# Patient Record
Sex: Female | Born: 1965 | Race: White | Hispanic: No | Marital: Single | Smoking: Never smoker
Health system: Southern US, Community
[De-identification: ages and names within clinical notes are randomized; demographics above are authoritative.]

## PROBLEM LIST (undated history)

## (undated) DIAGNOSIS — E559 Vitamin D deficiency, unspecified: Secondary | ICD-10-CM

## (undated) DIAGNOSIS — F419 Anxiety disorder, unspecified: Secondary | ICD-10-CM

## (undated) DIAGNOSIS — E039 Hypothyroidism, unspecified: Secondary | ICD-10-CM

## (undated) DIAGNOSIS — J309 Allergic rhinitis, unspecified: Secondary | ICD-10-CM

## (undated) DIAGNOSIS — K2970 Gastritis, unspecified, without bleeding: Secondary | ICD-10-CM

## (undated) DIAGNOSIS — K219 Gastro-esophageal reflux disease without esophagitis: Secondary | ICD-10-CM

## (undated) DIAGNOSIS — F341 Dysthymic disorder: Secondary | ICD-10-CM

## (undated) HISTORY — DX: Allergic rhinitis, unspecified: J30.9

## (undated) HISTORY — DX: Gastritis, unspecified, without bleeding: K29.70

## (undated) HISTORY — PX: KNEE SURGERY: SHX244

## (undated) HISTORY — DX: Hypothyroidism, unspecified: E03.9

## (undated) HISTORY — DX: Vitamin D deficiency, unspecified: E55.9

## (undated) HISTORY — DX: Gastro-esophageal reflux disease without esophagitis: K21.9

## (undated) HISTORY — DX: Anxiety disorder, unspecified: F41.9

## (undated) HISTORY — DX: Dysthymic disorder: F34.1

---

## 2012-03-26 ENCOUNTER — Other Ambulatory Visit: Payer: Self-pay | Admitting: Obstetrics & Gynecology

## 2012-03-26 DIAGNOSIS — Z1231 Encounter for screening mammogram for malignant neoplasm of breast: Secondary | ICD-10-CM

## 2012-04-13 ENCOUNTER — Ambulatory Visit: Payer: Self-pay

## 2012-04-29 ENCOUNTER — Ambulatory Visit: Payer: Self-pay

## 2012-08-04 ENCOUNTER — Ambulatory Visit
Admission: RE | Admit: 2012-08-04 | Discharge: 2012-08-04 | Disposition: A | Discharge status: Home / Self Care | Payer: Commercial Managed Care - PPO | Source: Ambulatory Visit | Attending: Obstetrics & Gynecology | Admitting: Obstetrics & Gynecology

## 2012-08-04 DIAGNOSIS — Z1231 Encounter for screening mammogram for malignant neoplasm of breast: Secondary | ICD-10-CM

## 2013-03-24 ENCOUNTER — Ambulatory Visit (INDEPENDENT_AMBULATORY_CARE_PROVIDER_SITE_OTHER): Payer: Commercial Managed Care - PPO | Admitting: Medical

## 2013-03-24 ENCOUNTER — Encounter: Payer: Self-pay | Admitting: Medical

## 2013-03-24 VITALS — BP 120/70 | HR 60 | Temp 98.1°F | Resp 16 | Wt 141.0 lb

## 2013-03-24 DIAGNOSIS — K2970 Gastritis, unspecified, without bleeding: Secondary | ICD-10-CM

## 2013-03-24 DIAGNOSIS — F4010 Social phobia, unspecified: Secondary | ICD-10-CM

## 2013-03-24 DIAGNOSIS — E039 Hypothyroidism, unspecified: Secondary | ICD-10-CM

## 2013-03-24 DIAGNOSIS — F40298 Other specified phobia: Secondary | ICD-10-CM

## 2013-03-24 DIAGNOSIS — F411 Generalized anxiety disorder: Secondary | ICD-10-CM

## 2013-03-24 DIAGNOSIS — F40248 Other situational type phobia: Secondary | ICD-10-CM

## 2013-03-24 DIAGNOSIS — F40243 Fear of flying: Secondary | ICD-10-CM

## 2013-03-24 NOTE — Progress Notes (Signed)
Subjective:  Colleen Berry is a 47 y.o. female who presents as a new patient today.  Moved from [place] to here 2011 for work.     Here to establish care.     She reports hx/o stomach issues.  2003 was treated with Aciphex for 37mo, did fine on this.  Has had intermittent issues with bloating, belching, excessive saliva in the throat since then.   Recently has had flare up that she has attributed to drinking wine on empty stomach or drinking more than 1 glass of wine at a time.  Things have improved since she cut back on wine intake.  Doesn't want to take any medication for this currently.    Has hx/o hypothyroidism.  Has ben on synthroid in the past, but for years now has been on Armour Thyroid.    In the past has used Valium or Clonazepam short term for flight trips or public speaking.  Flies to [place] about once yearly.  Would like to be able to get short term script for Valium or Clonazepam in the future.  No other c/o.  The following portions of the patient's history were reviewed and updated as appropriate: allergies, current medications, past family history, past medical history, past social history, past surgical history and problem list.  ROS Otherwise as in subjective above  Objective: Physical Exam  Vital signs reviewed  General appearance: alert, no distress, WD/WN HEENT: normocephalic, sclerae anicteric, conjunctiva pink and moist, TMs pearly, nares patent, no discharge or erythema, pharynx normal Oral cavity: MMM, no lesions Neck: supple, no lymphadenopathy, no thyromegaly, no masses Heart: RRR, normal S1, S2, no murmurs Lungs: CTA bilaterally, no wheezes, rhonchi, or rales Abdomen: +bs, soft, non tender, non distended, no masses, no hepatomegaly, no splenomegaly Pulses: 2+ radial pulses, 2+ pedal pulses, normal cap refill Ext: no edema   Assessment: Encounter Diagnoses  Name Primary?  . Gastritis Yes  . Hypothyroidism   . Anxiety state, unspecified   . Fear of  public speaking   . Fear of flying      Plan: Gastritis - avoid trigger, limit alcohol, and recheck if still causing problems Hypothyroidism - reviewed recent thyroid labs and CBC from gynecology which were normal Anxiety, fear of public speaking and flying - can use short term benzodiazepines prn for specific use. No prescription today.  Follow up: prn

## 2013-03-29 ENCOUNTER — Encounter: Payer: Self-pay | Admitting: Medical

## 2013-04-23 ENCOUNTER — Ambulatory Visit (INDEPENDENT_AMBULATORY_CARE_PROVIDER_SITE_OTHER): Payer: Commercial Managed Care - PPO | Admitting: Medical

## 2013-04-23 ENCOUNTER — Encounter: Payer: Self-pay | Admitting: Medical

## 2013-04-23 VITALS — BP 90/60 | HR 68 | Temp 97.9°F | Resp 16 | Wt 140.0 lb

## 2013-04-23 DIAGNOSIS — R110 Nausea: Secondary | ICD-10-CM

## 2013-04-23 DIAGNOSIS — K2970 Gastritis, unspecified, without bleeding: Secondary | ICD-10-CM

## 2013-04-23 MED ORDER — RABEPRAZOLE SODIUM 20 MG PO TBEC: DELAYED_RELEASE_TABLET | ORAL | Status: DC

## 2013-04-23 NOTE — Progress Notes (Signed)
Subjective: Here for "flare up" of stomach.   She has an underlying hx/o intermittent gastritis in the past.   Back in her '30s, had been seen by [facility] for similar, had thorough evaluation, endoscopy, other tests, and reportedly all were normal, not even showing GERD.  She ended up seeing a primary care provider who put her on Aciphex for a period of time and this resolved her symptoms.  She has gotten flare ups periodically over time, and she usually brings this on herself with dietary changes.   This past week she has had a big flare up of nausea and stomach issues that she attributes to drinking wine on an empty stomach.  Lately in the evenings she drinks about two 3 oz glasses of wine in the evening right when she gets home, then she eats dinner a little later.   Sometimes this doesn't cause problems but this past few days it really aggravated her stomach.   She has used some OTC Prilosec, tums and Pepto Bismol.  She is a little better today, but she wants to try back on Aciphex.  She is a nonsomker.  No family hx/o GI cancer.  No other aggravating or relieving factors.  No other c/o.  ROS as in subjective  Objective: Filed Vitals:   04/23/13 1610  BP: 90/60  Pulse: 68  Temp: 97.9 F (36.6 C)  Resp: 16    General appearance: alert, no distress, WD/WN Abdomen: +bs, soft, non tender, non distended, no masses, no hepatomegaly, no splenomegaly Oral: no lesions, pharynx normal   Assessment: Encounter Diagnoses  Name Primary?  . Gastritis Yes  . Nausea alone      Plan: Discussed symptoms, likely gastritis.  Recommendations were to limit alcohol intake in general, avoid alcohol on empty stomach,avoid acid foods and GERD triggers for now.   Begin short term Aciphex x 1-2 wk.  Can use additional tums and Zantac short term as well.  If not improving in 2wk, then call or return.  If having frequent flares ups in the next 1-2 mo, then recheck.  Plan to have endoscopy and colonoscopy age  47yo unless these symptoms worsen.

## 2013-04-23 NOTE — Patient Instructions (Addendum)
For flare ups, back off of spicy or acidic foods, caffeine, alcohol.   Begin Aciphex daily for 1-2 weeks.  You can also use tums, or antacids, or Zantac or Pepto as other remedies to help.  Gastritis, Adult Gastritis is soreness and swelling (inflammation) of the lining of the stomach. Gastritis can develop as a sudden onset (acute) or long-term (chronic) condition. If gastritis is not treated, it can lead to stomach bleeding and ulcers. CAUSES  Gastritis occurs when the stomach lining is weak or damaged. Digestive juices from the stomach then inflame the weakened stomach lining. The stomach lining may be weak or damaged due to viral or bacterial infections. One common bacterial infection is the Helicobacter pylori infection. Gastritis can also result from excessive alcohol consumption, taking certain medicines, or having too much acid in the stomach.  SYMPTOMS  In some cases, there are no symptoms. When symptoms are present, they may include:  Pain or a burning sensation in the upper abdomen.  Nausea.  Vomiting.  An uncomfortable feeling of fullness after eating. DIAGNOSIS  Your caregiver may suspect you have gastritis based on your symptoms and a physical exam. To determine the cause of your gastritis, your caregiver may perform the following:  Blood or stool tests to check for the H pylori bacterium.  Gastroscopy. A thin, flexible tube (endoscope) is passed down the esophagus and into the stomach. The endoscope has a light and camera on the end. Your caregiver uses the endoscope to view the inside of the stomach.  Taking a tissue sample (biopsy) from the stomach to examine under a microscope. TREATMENT  Depending on the cause of your gastritis, medicines may be prescribed. If you have a bacterial infection, such as an H pylori infection, antibiotics may be given. If your gastritis is caused by too much acid in the stomach, H2 blockers or antacids may be given. Your caregiver may recommend  that you stop taking aspirin, ibuprofen, or other nonsteroidal anti-inflammatory drugs (NSAIDs). HOME CARE INSTRUCTIONS  Only take over-the-counter or prescription medicines as directed by your caregiver.  If you were given antibiotic medicines, take them as directed. Finish them even if you start to feel better.  Drink enough fluids to keep your urine clear or pale yellow.  Avoid foods and drinks that make your symptoms worse, such as:  Caffeine or alcoholic drinks.  Chocolate.  Peppermint or mint flavorings.  Garlic and onions.  Spicy foods.  Citrus fruits, such as oranges, lemons, or limes.  Tomato-based foods such as sauce, chili, salsa, and pizza.  Fried and fatty foods.  Eat small, frequent meals instead of large meals. SEEK IMMEDIATE MEDICAL CARE IF:   You have black or dark red stools.  You vomit blood or material that looks like coffee grounds.  You are unable to keep fluids down.  Your abdominal pain gets worse.  You have a fever.  You do not feel better after 1 week.  You have any other questions or concerns. MAKE SURE YOU:  Understand these instructions.  Will watch your condition.  Will get help right away if you are not doing well or get worse. Document Released: 07/30/2001 Document Revised: 02/04/2012 Document Reviewed: 09/18/2011 [facility] Patient Information 2014 [place], [place].

## 2013-07-27 ENCOUNTER — Encounter: Payer: Self-pay | Admitting: Medical

## 2013-07-27 ENCOUNTER — Ambulatory Visit (INDEPENDENT_AMBULATORY_CARE_PROVIDER_SITE_OTHER): Payer: Commercial Managed Care - PPO | Admitting: Medical

## 2013-07-27 VITALS — BP 92/60 | HR 78 | Temp 98.2°F | Resp 14 | Wt 140.0 lb

## 2013-07-27 DIAGNOSIS — J329 Chronic sinusitis, unspecified: Secondary | ICD-10-CM

## 2013-07-27 DIAGNOSIS — R5381 Other malaise: Secondary | ICD-10-CM

## 2013-07-27 DIAGNOSIS — M791 Myalgia, unspecified site: Secondary | ICD-10-CM

## 2013-07-27 DIAGNOSIS — IMO0001 Reserved for inherently not codable concepts without codable children: Secondary | ICD-10-CM

## 2013-07-27 DIAGNOSIS — E559 Vitamin D deficiency, unspecified: Secondary | ICD-10-CM

## 2013-07-27 LAB — CBC WITH DIFFERENTIAL/PLATELET
Basophils Absolute: 0 10*3/uL (ref 0.0–0.1)
Eosinophils Absolute: 0.1 10*3/uL (ref 0.0–0.7)
Eosinophils Relative: 1 % (ref 0–5)
HCT: 38.4 % (ref 36.0–46.0)
Lymphocytes Relative: 28 % (ref 12–46)
Lymphs Abs: 2.3 10*3/uL (ref 0.7–4.0)
MCH: 31.8 pg (ref 26.0–34.0)
MCV: 93.2 fL (ref 78.0–100.0)
Monocytes Absolute: 1.1 10*3/uL — ABNORMAL HIGH (ref 0.1–1.0)
Platelets: 228 10*3/uL (ref 150–400)
RDW: 13.4 % (ref 11.5–15.5)
WBC: 8.4 10*3/uL (ref 4.0–10.5)

## 2013-07-27 LAB — CK: Total CK: 51 U/L (ref 7–177)

## 2013-07-27 LAB — POCT URINALYSIS DIPSTICK
Blood, UA: NEGATIVE
Ketones, UA: NEGATIVE
Spec Grav, UA: 1.02

## 2013-07-27 LAB — IRON AND TIBC
Iron: 115 ug/dL (ref 42–145)
TIBC: 312 ug/dL (ref 250–470)
UIBC: 197 ug/dL (ref 125–400)

## 2013-07-27 LAB — COMPREHENSIVE METABOLIC PANEL
BUN: 20 mg/dL (ref 6–23)
CO2: 25 mEq/L (ref 19–32)
Calcium: 9.5 mg/dL (ref 8.4–10.5)
Chloride: 108 mEq/L (ref 96–112)
Creat: 0.67 mg/dL (ref 0.50–1.10)
Total Bilirubin: 0.2 mg/dL — ABNORMAL LOW (ref 0.3–1.2)

## 2013-07-27 NOTE — Progress Notes (Signed)
Subjective Here for not feeling well.  She notes having similar symptoms 6wk that ultimately resolved after about a week, and now has new episode of symptoms of fatigue and aches.  Over the weekend she started feeling ill.   She notes 3 days ago started feeling extreme fatigue, muscle aches, muscular fatigue, sleepy, stayed in bed all day.  Has felt hot and flushed.  Has had sore irritated throat, pressure in sinuses.  Denies sweats, no chills, no sneezing, no coughing, was unable to go to work Monday.   Today she only notes 10% of symptoms, some improvement.    Review of Systems Constitutional: +subjectively fever/hot/flushed, -chills, -sweats, -unexpected weight change, +fatigue ENT: -runny nose, -ear pain, -sore throat, +mild sinus irritation/itching, no purulent nasal drainage, -sneezing, no teeth pain Cardiology:  -chest pain, -palpitations, -edema Respiratory: -cough, -shortness of breath, -wheezing Gastroenterology: -abdominal pain, -nausea, -vomiting, -diarrhea, -constipation Hematology: -bleeding or bruising problems Musculoskeletal: -arthralgias, +myalgias, -joint swelling, +back soreness Ophthalmology: -vision changes Urology: -dysuria, -difficulty urinating, -hematuria, -urinary frequency, -urgency Neurology: -headache, -weakness, -tingling, -numbness   Past Medical History  Diagnosis Date  . Hypothyroidism   . Allergic rhinitis     dust  . Dysthymia   . Anxiety     fear of flight, public speaking  . Gastritis   . GERD (gastroesophageal reflux disease)   . Vitamin D deficiency   . Routine gynecological examination     Dr. Juliene Pina, pap 1/14  . H/O mammogram 2/14   Objective: Filed Vitals:   07/27/13 1612  BP: 92/60  Pulse: 78  Temp: 98.2 F (36.8 C)  Resp: 14    General appearance: alert, no distress, WD/WN HEENT: normocephalic, sclerae anicteric, TMs flat, left TM with serous effusion, nares patent, but some turbinates edema, no discharge, + erythema, pharynx with  mild erythema Oral cavity: MMM, no lesions Neck: supple, no lymphadenopathy, no thyromegaly, no masses Heart: RRR, normal S1, S2, no murmurs Lungs: CTA bilaterally, no wheezes, rhonchi, or rales Abdomen: +bs, soft, non tender, non distended, no masses, no hepatomegaly, no splenomegaly Pulses: 2+ symmetric, upper and lower extremities, normal cap refill Neuro: CN2-12 intact, nonfocal exam Ext: no edema   Assessment: Encounter Diagnoses  Name Primary?  . Other malaise and fatigue Yes  . Myalgia   . Unspecified vitamin D deficiency   . Sinusitis      Plan: After reviewing symptoms, her 2 episodes, 6wk ago and current episodes could be associated with either sinusitis or individual URIs, likely contracted through exposure to sick contacts.  However ,she request labs to help rule out other causes of fatigue.  Thus, lab panel today, rest ,hydrate well, consider OTC mucinex for possible sinus symptoms, salt water gargles.  Follow-up pending labs.

## 2013-07-28 LAB — VITAMIN D 25 HYDROXY (VIT D DEFICIENCY, FRACTURES): Vit D, 25-Hydroxy: 95 ng/mL — ABNORMAL HIGH (ref 30–89)

## 2013-07-28 LAB — TSH: TSH: 0.34 u[IU]/mL — ABNORMAL LOW (ref 0.350–4.500)

## 2013-07-28 LAB — T3: T3, Total: 55 ng/dL — ABNORMAL LOW (ref 80.0–204.0)

## 2014-01-24 ENCOUNTER — Ambulatory Visit (INDEPENDENT_AMBULATORY_CARE_PROVIDER_SITE_OTHER): Payer: Commercial Managed Care - PPO | Admitting: Medical

## 2014-01-24 ENCOUNTER — Encounter: Payer: Self-pay | Admitting: Medical

## 2014-01-24 VITALS — BP 102/70 | HR 62 | Temp 97.6°F | Resp 16 | Wt 148.0 lb

## 2014-01-24 DIAGNOSIS — F418 Other specified anxiety disorders: Secondary | ICD-10-CM

## 2014-01-24 DIAGNOSIS — F411 Generalized anxiety disorder: Secondary | ICD-10-CM

## 2014-01-24 MED ORDER — ALPRAZOLAM 0.5 MG PO TABS: 0.5000 mg | ORAL_TABLET | Freq: Every evening | ORAL | Status: DC | PRN

## 2014-01-24 MED ORDER — DIAZEPAM 10 MG PO TABS: ORAL_TABLET | ORAL | Status: DC

## 2014-01-24 NOTE — Progress Notes (Signed)
   Subjective:   Colleen Berry is a 48 y.o. female presenting on 01/24/2014 with ANXIETY MEDICATION FOR FLYING  Here to request Valium.  In the past has used this when flying given anxiety with flying.  generally gets palpitations, feeling on edge, elevated heart rate with turbulence.  No SOB, numbness, tingling.  Otherwise doing ok.  Is a Pensions consultant.  Is going to [place] and [place]. [place] and doing other flying tours.  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  Still taking 3 Armour Thyroid tablets daily.  Sees Alternative Medicine specialist for thyroid.  She is a nonsmoker.  Drinks wine occasionally.  No other aggravating or relieving factors.  No other complaint.  Review of Systems ROS as in subjective      Objective:     Filed Vitals:   01/24/14 0901  BP: 102/70  Pulse: 62  Temp: 97.6 F (36.4 C)  Resp: 16    General appearance: alert, no distress, WD/WN      Assessment: Encounter Diagnosis  Name Primary?  . Situational anxiety Yes     Plan: Scripts for both Valium for long flights, and Xanax for sleep and small planes.  don't use both same day, discussed proper use, risks/benefits of medication    Colleen Berry was seen today for anxiety medication for flying.  Diagnoses and associated orders for this visit:  Situational anxiety  Other Orders - diazepam (VALIUM) 10 MG tablet; 1/2-1 tablet po prn for flight anxiety - ALPRAZolam (XANAX) 0.5 MG tablet; Take 1 tablet (0.5 mg total) by mouth at bedtime as needed for anxiety.    Return if symptoms worsen or fail to improve.

## 2014-04-06 ENCOUNTER — Telehealth: Payer: Self-pay | Admitting: Medical

## 2014-04-06 ENCOUNTER — Other Ambulatory Visit: Payer: Self-pay | Admitting: Medical

## 2014-04-06 MED ORDER — DIAZEPAM 10 MG PO TABS: ORAL_TABLET | ORAL | Status: DC

## 2014-04-06 NOTE — Telephone Encounter (Signed)
Please  Call and advise pt when/if Rx called in  Patient states you gave rx previously for a trip to [place] and now she is going to [place] and wants to know if you can give Rx for that trip    [employer]

## 2014-04-06 NOTE — Telephone Encounter (Signed)
Pt notified of results

## 2014-04-06 NOTE — Telephone Encounter (Signed)
Error, I meant to typee med was called into pharmacy

## 2014-04-06 NOTE — Telephone Encounter (Signed)
Call out Valium

## 2014-06-16 ENCOUNTER — Telehealth: Payer: Self-pay | Admitting: Medical

## 2014-06-16 ENCOUNTER — Ambulatory Visit (INDEPENDENT_AMBULATORY_CARE_PROVIDER_SITE_OTHER): Payer: Commercial Managed Care - PPO | Admitting: Medical

## 2014-06-16 ENCOUNTER — Encounter: Payer: Self-pay | Admitting: Medical

## 2014-06-16 VITALS — BP 108/68 | HR 60 | Temp 97.4°F | Resp 15

## 2014-06-16 DIAGNOSIS — R140 Abdominal distension (gaseous): Secondary | ICD-10-CM

## 2014-06-16 DIAGNOSIS — K2100 Gastro-esophageal reflux disease with esophagitis, without bleeding: Secondary | ICD-10-CM

## 2014-06-16 DIAGNOSIS — E038 Other specified hypothyroidism: Secondary | ICD-10-CM

## 2014-06-16 DIAGNOSIS — R6889 Other general symptoms and signs: Secondary | ICD-10-CM

## 2014-06-16 DIAGNOSIS — R110 Nausea: Secondary | ICD-10-CM

## 2014-06-16 MED ORDER — ONDANSETRON HCL 4 MG PO TABS: 4.0000 mg | ORAL_TABLET | Freq: Three times a day (TID) | ORAL | Status: DC | PRN

## 2014-06-16 NOTE — Telephone Encounter (Signed)
Refer to Dr. Benson Norway for GI issues, chronic GERD issues, needs EGD and Colonoscopy.   If they can't get her in within 2 weeks for consult, let me know.

## 2014-06-16 NOTE — Telephone Encounter (Signed)
pls send copy of my note to Dr. Benjie Karvonen at [facility] and highlight abnormal pelvic findings and make sure Dr. Gardiner Coins nurse gets her scheduled for routine f/u soon.

## 2014-06-16 NOTE — Progress Notes (Signed)
Subjective: Here for recurrence of GERD, nausea, reflux.   She has chronic long history of episodes of severe nausea, reflux, bloating.  Prior has seen [facility], has prior EGDs, prior biopsies, prior evaluation with no major findings.  Last EGD possibly 15 years ago.  I have seen her a few prior times for the same.    In the last week had 1 bad episode of severe nausea when at [facility] after eating broccoli, glass of wine, chicken.   Was heaving but didn't actually vomit.  Ongoing symptoms including reflux, food coming up, nausea, increased belching.   She restarted Acipehx, tums.    She sees Dr. Benjie Karvonen, gyn, has f/u soon.  She is nulliparous  She sees alternative medicine physician for hypothyroidism, has had repeat normal labs since last visit here.   family hx/o: 1st cousin ovarian cancer Mom menopause, hysterectomy in 62s ? Not sure why, Mom diverticulitis No other GI family history   Past Medical History  Diagnosis Date  . Hypothyroidism   . Allergic rhinitis     dust  . Dysthymia   . Anxiety     fear of flight, public speaking  . Gastritis   . GERD (gastroesophageal reflux disease)   . Vitamin D deficiency   . Routine gynecological examination     Dr. Benjie Karvonen, pap 1/14  . H/O mammogram 2/14    Past Surgical History  Procedure Laterality Date  . Knee surgery      multiple     Objective: Filed Vitals:   06/16/14 1446  BP: 108/68  Pulse: 60  Temp: 97.4 F (36.3 C)  Resp: 15    General appearance: alert, no distress, WD/WN Oral cavity: MMM, no lesions Neck: supple, no lymphadenopathy, no thyromegaly, no masses Heart: RRR, normal S1, S2, no murmurs Lungs: CTA bilaterally, no wheezes, rhonchi, or rales Abdomen: +bs, soft, mild periumbilical tenderness, right adnexal tenderness/right lower abdomen just lateral of midline, possible ovarian mass, otherwise non tender, non distended, no masses, no hepatomegaly, no  splenomegaly Pulses: 2+ symmetric, upper and lower extremities, normal cap refill Ext: no edema   Assessment: Encounter Diagnoses  Name Primary?  . Gastroesophageal reflux disease with esophagitis Yes  . Abdominal bloating   . Abnormal exam or test finding   . Nausea without vomiting   . Other specified hypothyroidism    Plan: C/t aciphex, tums.   Referral to GI for this c/o, chronic as well as need for baseline colonoscopy.  She also requests H pylori testing which is reasonable and can be done with EGD.  nausea - rx for zofran  Hypothyroidism - followed by alternative medicine physician here in [facility]  Pelvic finding - right adnexa seems full/possible cyst of ovary, hard to tell.  She will see her gynecologist soon for yearly exam and further eval of this.

## 2014-06-17 ENCOUNTER — Other Ambulatory Visit: Payer: Self-pay | Admitting: Family Medicine

## 2014-06-17 DIAGNOSIS — K219 Gastro-esophageal reflux disease without esophagitis: Secondary | ICD-10-CM

## 2014-06-17 NOTE — Telephone Encounter (Signed)
I fax over [place] notes to Dr. Benjie Karvonen office.

## 2014-06-17 NOTE — Telephone Encounter (Signed)
error 

## 2014-06-17 NOTE — Telephone Encounter (Signed)
Patient is aware of her appointment to see Dr. Collene Mares on 06/21/14 @ 200 pm [facility]

## 2014-06-21 ENCOUNTER — Telehealth: Payer: Self-pay | Admitting: Internal Medicine

## 2014-06-21 NOTE — Telephone Encounter (Signed)
Pt wanted to let you know that she went to see Dr. Collene Mares today for consult. She is scheduled December 11th @ 3:00pm for endoscopy and colonoscopy.

## 2014-06-22 ENCOUNTER — Other Ambulatory Visit: Payer: Self-pay | Admitting: Gastroenterology

## 2014-06-22 DIAGNOSIS — R1084 Generalized abdominal pain: Secondary | ICD-10-CM

## 2014-06-22 DIAGNOSIS — R110 Nausea: Secondary | ICD-10-CM

## 2014-07-01 ENCOUNTER — Ambulatory Visit (INDEPENDENT_AMBULATORY_CARE_PROVIDER_SITE_OTHER): Payer: Commercial Managed Care - PPO | Admitting: Medical

## 2014-07-01 ENCOUNTER — Encounter: Payer: Self-pay | Admitting: Medical

## 2014-07-01 VITALS — BP 90/60 | HR 74 | Temp 98.0°F

## 2014-07-01 DIAGNOSIS — J069 Acute upper respiratory infection, unspecified: Secondary | ICD-10-CM

## 2014-07-01 DIAGNOSIS — J029 Acute pharyngitis, unspecified: Secondary | ICD-10-CM

## 2014-07-01 LAB — POCT RAPID STREP A (OFFICE): Rapid Strep A Screen: NEGATIVE

## 2014-07-01 NOTE — Progress Notes (Signed)
Subjective:  Colleen Berry is a 48 y.o. female who presents for evaluation of sore throat.  Started with cold symptoms, got a little better, but has sore throat that has come on with a vengeance.   She has not had a recent close exposure to someone with proven streptococcal pharyngitis.  Associated symptoms include fatigue, sore throat, horrible pain all night, some nasal congestion, pressure in sinuses, some sneezing.  Has mild cough.  No nausea, no vomiting. Treatment to date: few aspirin last night.  No sick contacts.  No other aggravating or relieving factors.    Since last visit she did see Dr. Collene Mares gastroenterology, she has EGD, colonoscopy and gallbladder study pending  Since last visit she also saw gynecology, they did an exam didn't necessarily feel anything abnormal but she does have an ultrasound of her pelvis pending  No other c/o.  The following portions of the patient's history were reviewed and updated as appropriate: allergies, current medications, past medical history, past social history, past surgical history and problem list.  ROS as in subjective   Objective: Filed Vitals:   07/01/14 1342  BP: 90/60  Pulse: 74  Temp: 98 F (36.7 C)    General appearance: no distress, WD/WN, mildly ill-appearing HEENT: normocephalic, conjunctiva/corneas normal, sclerae anicteric, nares patent, no discharge or erythema, pharynx with erythema, no exudate.  Oral cavity: MMM, no lesions  Neck: supple, no lymphadenopathy, no thyromegaly Lungs: CTA bilaterally, no wheezes, rhonchi, or rales   Laboratory Strep test done. Results:negative.    Assessment and Plan: Encounter Diagnoses  Name Primary?  . Sore throat Yes  . Viral upper respiratory infection     Advised that symptoms and exam suggest a viral etiology.  Discussed symptomatic treatment including salt water gargles, warm fluids, rest, hydrate well, can use over-the-counter Tylenol and Ibuprofen for throat pain, fever, or  malaise. If worse or not improving within 2-3 days, call or return.

## 2014-07-19 HISTORY — PX: COLONOSCOPY: SHX174

## 2014-07-26 ENCOUNTER — Other Ambulatory Visit: Payer: Self-pay | Admitting: Obstetrics & Gynecology

## 2014-07-26 DIAGNOSIS — R140 Abdominal distension (gaseous): Secondary | ICD-10-CM

## 2014-07-26 DIAGNOSIS — R110 Nausea: Secondary | ICD-10-CM

## 2014-07-27 ENCOUNTER — Ambulatory Visit
Admission: RE | Admit: 2014-07-27 | Discharge: 2014-07-27 | Disposition: A | Discharge status: Home / Self Care | Payer: Commercial Managed Care - PPO | Source: Ambulatory Visit | Attending: Obstetrics & Gynecology | Admitting: Obstetrics & Gynecology

## 2014-07-27 ENCOUNTER — Encounter (INDEPENDENT_AMBULATORY_CARE_PROVIDER_SITE_OTHER): Payer: Self-pay

## 2014-07-27 DIAGNOSIS — R140 Abdominal distension (gaseous): Secondary | ICD-10-CM

## 2014-07-27 DIAGNOSIS — R110 Nausea: Secondary | ICD-10-CM

## 2014-07-27 MED ORDER — IOHEXOL 300 MG/ML  SOLN
100.0000 mL | Freq: Once | INTRAMUSCULAR | Status: AC | PRN
  Administered 2014-07-27: 100 mL via INTRAVENOUS

## 2014-07-29 LAB — HM COLONOSCOPY

## 2014-08-03 ENCOUNTER — Telehealth: Payer: Self-pay | Admitting: *Deleted

## 2014-08-03 MED ORDER — IMIQUIMOD 5 % EX CREA: TOPICAL_CREAM | CUTANEOUS | Status: DC

## 2014-08-03 NOTE — Telephone Encounter (Signed)
Called in [place] for her.

## 2014-08-03 NOTE — Telephone Encounter (Addendum)
Pt states she would like refill of Aldara cream to be used on plantar warts.  Dr. Valentina Lucks ordered the Aldara generic cream and I informed pt of the order and to make an appt if the problem continued.

## 2014-08-04 ENCOUNTER — Encounter: Payer: Self-pay | Admitting: Medical

## 2014-08-05 ENCOUNTER — Encounter: Payer: Self-pay | Admitting: Internal Medicine

## 2014-10-12 ENCOUNTER — Encounter: Payer: Self-pay | Admitting: Medical

## 2014-10-12 ENCOUNTER — Ambulatory Visit (INDEPENDENT_AMBULATORY_CARE_PROVIDER_SITE_OTHER): Payer: Commercial Managed Care - PPO | Admitting: Medical

## 2014-10-12 VITALS — BP 98/70 | HR 64 | Temp 97.8°F | Resp 15 | Wt 144.0 lb

## 2014-10-12 DIAGNOSIS — R109 Unspecified abdominal pain: Secondary | ICD-10-CM

## 2014-10-12 DIAGNOSIS — R112 Nausea with vomiting, unspecified: Secondary | ICD-10-CM

## 2014-10-12 DIAGNOSIS — G8929 Other chronic pain: Secondary | ICD-10-CM

## 2014-10-12 DIAGNOSIS — R21 Rash and other nonspecific skin eruption: Secondary | ICD-10-CM

## 2014-10-12 DIAGNOSIS — R195 Other fecal abnormalities: Secondary | ICD-10-CM

## 2014-10-12 MED ORDER — PROMETHAZINE HCL 25 MG PO TABS: 25.0000 mg | ORAL_TABLET | Freq: Three times a day (TID) | ORAL | Status: DC | PRN

## 2014-10-12 MED ORDER — ONDANSETRON HCL 4 MG PO TABS: 4.0000 mg | ORAL_TABLET | Freq: Three times a day (TID) | ORAL | Status: DC | PRN

## 2014-10-12 MED ORDER — ALIGN 4 MG PO CAPS: 1.0000 | ORAL_CAPSULE | Freq: Every day | ORAL | Status: DC

## 2014-10-12 NOTE — Progress Notes (Signed)
Subjective: Here for f/u on stomach issues.  She has chronic hx/o stomach issues since age 49yo, and recently I referred her to GI/Dr. Collene Mares, who performed EGD and colonoscopy.   She was also scheduled for HIDA scan and abdomina ultrasound but hasn't had these yet.   Given abnormal abdominal exam, saw gyn at my request, had CT pelvis showing cyst of right ovary. He had an elevated CA 125 and has had serial CA 125s by gyn.   regarding her abdominal symptoms, In December and January things went well, was also taking Reglan occasionally per gyn, but this past weekend, totally incapacitated due to stomach issues.   Struggled through work Monday and Tuesday, but yesterday ok.  Early in the week had significant nausea, queasy, affected her whole body, worse with eating, lots of belching, heaving over the toilet at times.  Sometimes this relieves pressure sometimes not.  The last 6 weeks has had tiny dribs and drabs of her stool, not liquid, yellow orange color.  She was curious about a stool test for infection.  In general BM daily.    She has some irritated skin of both lateral orbits she wants looked at.   ROS as in subjective  Objective: BP 98/70 mmHg  Pulse 64  Temp(Src) 97.8 F (36.6 C) (Oral)  Resp 15  Wt 144 lb (65.318 kg)  Gen: wd, wn, nad Skin: bilat lateral orbits with irritated pink/red skin, nonspecific Otherwise not examined   Assessment: Encounter Diagnoses  Name Primary?  . Chronic abdominal pain Yes  . Change in stool   . Nausea and vomiting, vomiting of unspecified type   . Rash of face    Plan: I reviewed the recent CT abdomen and pelvis done by gynecology, I reviewed the recent gastroenterology notes, pathology notes from her EGD and colonoscopy.  Abnormalities included right ovarian hemorrhagic cyst, elevated CEA 125 that is being followed by gynecology, gastropathy on the EGD  Her issues are chronic and are episodic.  We discussed symptoms waist to manage diet and  suggestions below.  Patient Instructions  Recommendations:   Take a daily probiotic supplement such as Align  Take a daily fiber supplement such as FiberCon or Metamucil  For mild nausea use Zofran 1 tablet every 4-6 hours  For severe nausea use promethazine/Phenergan 1 tablet every 6-8 hours  Eat frequent small meals instead of 3 big meals  Avoid acidic foods spicy foods greasy foods or large portions  Limit caffeine use   If you continue to get loose stool or abnormal fragmented stool we can consider Ova and parasite test   Call back in 3-4 weeks to let me know how this is working   For the facial skin area use Eucerin cream or over-the-counter Cortaid for up to a week   Follow-up in one month

## 2014-10-12 NOTE — Patient Instructions (Signed)
Recommendations:   Take a daily probiotic supplement such as Align  Take a daily fiber supplement such as FiberCon or Metamucil  For mild nausea use Zofran 1 tablet every 4-6 hours  For severe nausea use promethazine/Phenergan 1 tablet every 6-8 hours  Eat frequent small meals instead of 3 big meals  Avoid acidic foods spicy foods greasy foods or large portions  Limit caffeine use   If you continue to get loose stool or abnormal fragmented stool we can consider Ova and parasite test   Call back in 3-4 weeks to let me know how this is working   For the facial skin area use Eucerin cream or over-the-counter Cortaid for up to a week

## 2015-02-15 ENCOUNTER — Ambulatory Visit (INDEPENDENT_AMBULATORY_CARE_PROVIDER_SITE_OTHER): Payer: Commercial Managed Care - PPO | Admitting: Medical

## 2015-02-15 ENCOUNTER — Telehealth: Payer: Self-pay | Admitting: Medical

## 2015-02-15 ENCOUNTER — Encounter: Payer: Self-pay | Admitting: Medical

## 2015-02-15 VITALS — BP 92/70 | HR 74 | Resp 15 | Ht 64.0 in | Wt 140.0 lb

## 2015-02-15 DIAGNOSIS — E663 Overweight: Secondary | ICD-10-CM | POA: Diagnosis not present

## 2015-02-15 DIAGNOSIS — R413 Other amnesia: Secondary | ICD-10-CM | POA: Diagnosis not present

## 2015-02-15 DIAGNOSIS — R6889 Other general symptoms and signs: Secondary | ICD-10-CM | POA: Diagnosis not present

## 2015-02-15 DIAGNOSIS — R638 Other symptoms and signs concerning food and fluid intake: Secondary | ICD-10-CM

## 2015-02-15 DIAGNOSIS — E038 Other specified hypothyroidism: Secondary | ICD-10-CM

## 2015-02-15 LAB — CBC
HEMATOCRIT: 38.8 % (ref 36.0–46.0)
Hemoglobin: 13 g/dL (ref 12.0–15.0)
MCH: 31.2 pg (ref 26.0–34.0)
MCHC: 33.5 g/dL (ref 30.0–36.0)
MCV: 93 fL (ref 78.0–100.0)
MPV: 10.6 fL (ref 8.6–12.4)
Platelets: 300 10*3/uL (ref 150–400)
RBC: 4.17 MIL/uL (ref 3.87–5.11)
RDW: 13.6 % (ref 11.5–15.5)
WBC: 7.3 10*3/uL (ref 4.0–10.5)

## 2015-02-15 LAB — FOLATE: Folate: 9.2 ng/mL

## 2015-02-15 LAB — T3: T3, Total: 155.9 ng/dL (ref 80.0–204.0)

## 2015-02-15 LAB — T4, FREE: Free T4: 1.1 ng/dL (ref 0.80–1.80)

## 2015-02-15 LAB — VITAMIN B12: Vitamin B-12: 762 pg/mL (ref 211–911)

## 2015-02-15 LAB — TSH: TSH: 0.028 u[IU]/mL — AB (ref 0.350–4.500)

## 2015-02-15 MED ORDER — THYROID 30 MG PO TABS: 30.0000 mg | ORAL_TABLET | Freq: Every day | ORAL | Status: DC

## 2015-02-15 MED ORDER — PHENTERMINE HCL 37.5 MG PO TABS: 37.5000 mg | ORAL_TABLET | Freq: Every day | ORAL | Status: DC

## 2015-02-15 NOTE — Addendum Note (Signed)
Addended by: Carlena Hurl on: 02/15/2015 05:21 PM   Modules accepted: Orders

## 2015-02-15 NOTE — Telephone Encounter (Signed)
pls call pharmacy and verify thyroid medication dose so we can document it correctly in chart.   She thinks it was Armour Thyroid 1.5 grains daily, but verify.   Electronically order and lets given 2 refills

## 2015-02-15 NOTE — Progress Notes (Signed)
Subjective: Here for several concerns  Hypothyroidism - has been on Armour Thyroid for years.  Wants to have me take over this medication.  Currently feels good, no fatigue, no energy issues, no palpitations or headaches.    Was on Synthroid at one point, but never got a good level, but once they switched to Armour, has done well.  Always been on the same dose.     Wants refill on phentermine.  Has used a few times prior.  Wants to get to 135lb.  With her thyroid issues has trouble getting to goal.  Easy to pack on weight, and she is careful with diet, exercises regularly.   Lost 11 lb since January.  Got up close to 150lb and felt miserable.    Concerned about memory.  Feels like she has problems remembering things worse than she would expect at this age, particular compared to coworkers.   Frequently will forget about topics she had with coworkers or clients from just days prior.  Forgets people names or has trouble recalling names at the time she needs to speak their name such as in a meeting. Even calls her own dog by different name at times .  Sometimes forgets the date.  This has been an issue the last 3 years.   Over her life she has been forgetful at times, but this is different.   Denies focus issues.  She is very focused on her job otherwise.  Mood is ok, not very happy, but not depressed.   Is at her baseline or usual.   Periods are regular.  She does have insomnia issues, use Nyquil somewhat regularly.    ROS as in subjective  Past Medical History  Diagnosis Date  . Hypothyroidism   . Allergic rhinitis     dust  . Dysthymia   . Anxiety     fear of flight, public speaking  . Gastritis   . GERD (gastroesophageal reflux disease)   . Vitamin D deficiency   . Routine gynecological examination     Dr. Benjie Karvonen, pap 1/14  . H/O mammogram 2/14      Objective: BP 92/70 mmHg  Pulse 74  Resp 15  General appearance: alert, no distress, WD/WN HEENT: normocephalic, sclerae anicteric,  PERRLA, EOMi, nares patent, no discharge or erythema, pharynx normal Oral cavity: MMM, no lesions Neck: supple, no lymphadenopathy, no thyromegaly, no masses, no bruits Heart: RRR, normal S1, S2, no murmurs Lungs: CTA bilaterally, no wheezes, rhonchi, or rales Abdomen: +bs, soft, non tender, non distended, no masses, no hepatomegaly, no splenomegaly Back: non tender Musculoskeletal: nontender, no swelling, no obvious deformity Extremities: no edema, no cyanosis, no clubbing Pulses: 2+ symmetric, upper and lower extremities, normal cap refill Neurological: alert, oriented x 3, CN2-12 intact, strength normal upper extremities and lower extremities, sensation normal throughout, DTRs 2+ throughout, no cerebellar signs, gait normal Psychiatric: normal affect, behavior normal, pleasant      Assessment: Encounter Diagnoses  Name Primary?  . Other specified hypothyroidism Yes  . Overweight   . Unable to lose weight   . Memory loss   . Forgetfulness     Plan: Hypothyroidism - been on current dose of Armour Thyroid for years.  Routine labs today.   Dicussed proper use  Overweigh, unable to lose weight - can c/t Phentermine short term.  discussed diet, exercise and she is practicing healthy lifestyle, phentermine for now.  discussed risks/benefits of medication, f/u.  Memory loss, forgetfulness - MMSE today with  score of 28/30.   Discussed general recommendations for helping with memory and keeping brain function sharp.   Can consider OTC Gingko Biloba.  Routine labs today.  Start cutting back on Nyquil.  consider counseling in regard to mood.

## 2015-02-15 NOTE — Telephone Encounter (Signed)
The pharmacists said that the patient is on .5 of Armour Thyroid

## 2015-02-16 LAB — COMPREHENSIVE METABOLIC PANEL
ALT: 9 U/L (ref 0–35)
AST: 15 U/L (ref 0–37)
Albumin: 4.3 g/dL (ref 3.5–5.2)
Alkaline Phosphatase: 41 U/L (ref 39–117)
BUN: 11 mg/dL (ref 6–23)
CHLORIDE: 104 meq/L (ref 96–112)
CO2: 27 meq/L (ref 19–32)
Calcium: 9.9 mg/dL (ref 8.4–10.5)
Creat: 0.59 mg/dL (ref 0.50–1.10)
Glucose, Bld: 88 mg/dL (ref 70–99)
Potassium: 4.3 mEq/L (ref 3.5–5.3)
Sodium: 140 mEq/L (ref 135–145)
TOTAL PROTEIN: 7 g/dL (ref 6.0–8.3)
Total Bilirubin: 0.3 mg/dL (ref 0.2–1.2)

## 2015-02-16 LAB — LIPID PANEL
Cholesterol: 182 mg/dL (ref 0–200)
HDL: 68 mg/dL (ref >=46)
LDL Cholesterol: 95 mg/dL (ref 0–99)
TRIGLYCERIDES: 95 mg/dL (ref <150)
Total CHOL/HDL Ratio: 2.7 Ratio
VLDL: 19 mg/dL (ref 0–40)

## 2015-05-16 ENCOUNTER — Encounter: Payer: Self-pay | Admitting: Family Medicine

## 2015-05-16 ENCOUNTER — Ambulatory Visit (INDEPENDENT_AMBULATORY_CARE_PROVIDER_SITE_OTHER): Payer: Commercial Managed Care - PPO | Admitting: Family Medicine

## 2015-05-16 VITALS — BP 114/76 | HR 60 | Wt 145.0 lb

## 2015-05-16 DIAGNOSIS — F418 Other specified anxiety disorders: Secondary | ICD-10-CM | POA: Diagnosis not present

## 2015-05-16 MED ORDER — DIAZEPAM 10 MG PO TABS: ORAL_TABLET | ORAL | Status: DC

## 2015-05-16 NOTE — Progress Notes (Signed)
   Subjective:    Patient ID: Colleen Berry, female    DOB: 09-21-1965, 49 y.o.   MRN: [id number]  HPI She is here requesting anxiety medication for an upcoming flight. 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 She recently tried a short flight without anxiety medication and states it did not go well.  Denies anxiety except for in certain situations. Denies any complaints today otherwise. She denies fever, chills, unexplained weight loss, or recent illness.  Reviewed allergies, medications, social history.   Review of Systems Pertinent positives and negatives in the history of present illness.    Objective:   Physical Exam  Constitutional: She is oriented to person, place, and time. She appears well-developed and well-nourished. No distress.  Neurological: She is alert and oriented to person, place, and time. Coordination normal.  Skin: Skin is warm and dry.  Psychiatric: She has a normal mood and affect. Her behavior is normal. Judgment and thought content normal.          Assessment & Plan:  Situational anxiety  Prescribed Valium, script printed and given to patient, for her flight and discussed side effects. Also discussed no alcohol use with medication. She has done well with medication in past and expect that she will not have any problems.

## 2015-07-22 IMAGING — CT CT ABD-PELV W/ CM
3 of 5 series · 13 of 36 positions shown, 19 images · IV contrast (READICAT/WATER & [ID] OMNI 300)
Comparison: None.

CLINICAL DATA: Abdominal bloating, pelvic pain, nausea. Elevated CA
125.

EXAM:
CT ABDOMEN AND PELVIS WITH CONTRAST
TECHNIQUE: Multidetector CT imaging of the abdomen and pelvis was performed
using the standard protocol following bolus administration of
intravenous contrast.
CONTRAST:  100mL OMNIPAQUE IOHEXOL 300 MG/ML  SOLN

[Series 3: abd/pelvis with · axial · 0.70mm/px · z∈[-281,+34]mm · 8 of 83 slices shown, 13 images]
[im 10/83  soft-tissue]
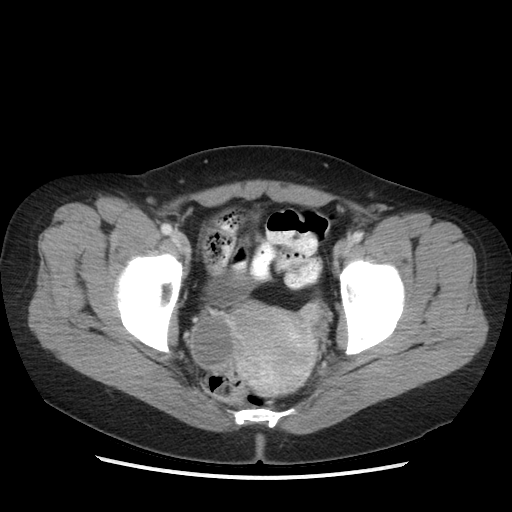
[im 10/83  bone]
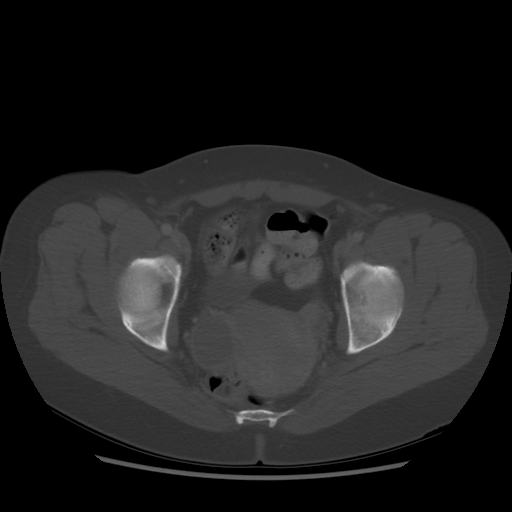
[im 19/83  soft-tissue]
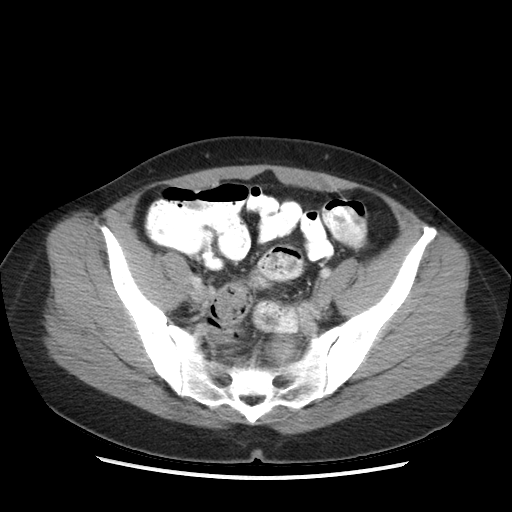
[im 28/83  soft-tissue]
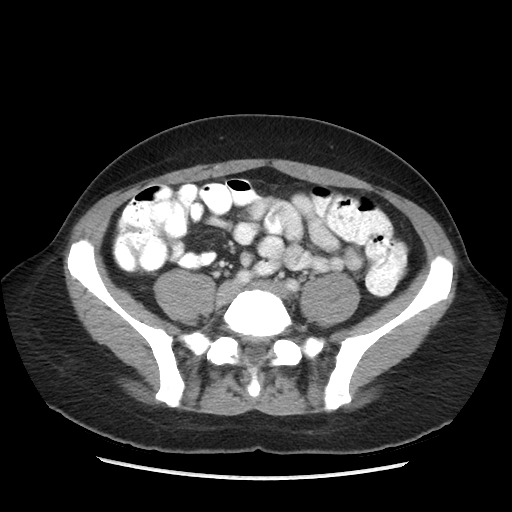
[im 37/83  soft-tissue]
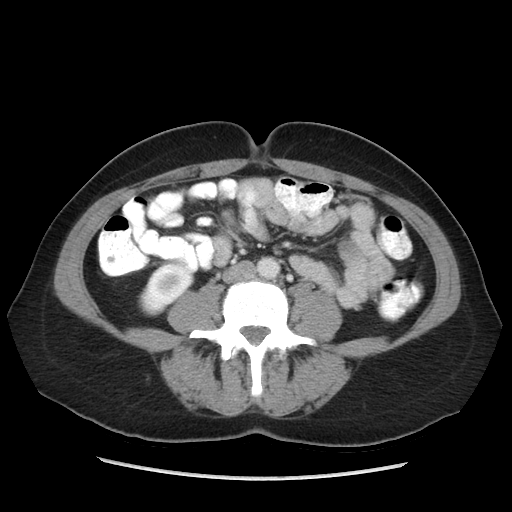
[im 46/83  soft-tissue]
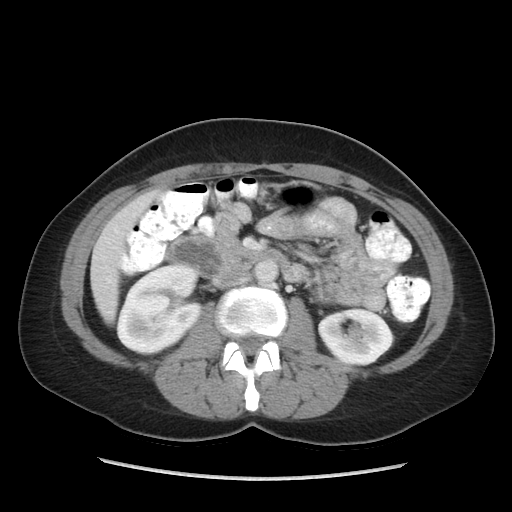
[im 46/83  lung]
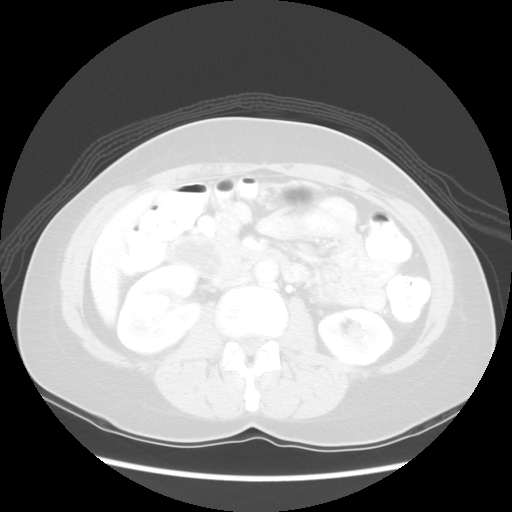
[im 55/83  soft-tissue]
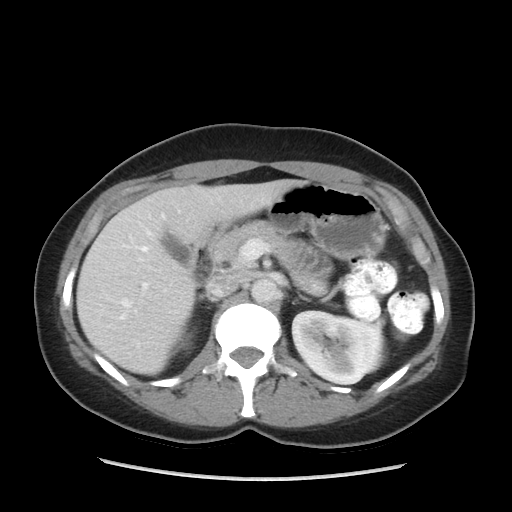
[im 55/83  lung]
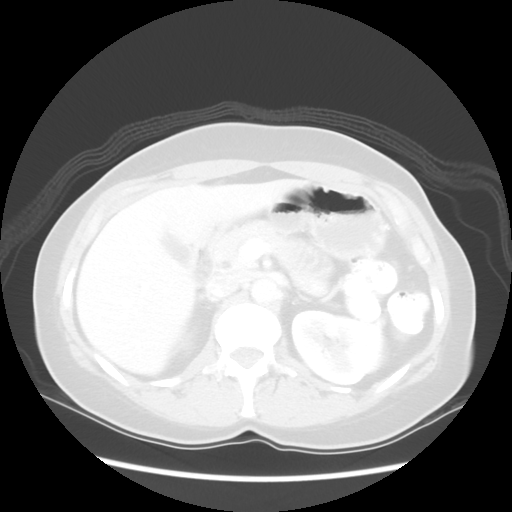
[im 64/83  soft-tissue]
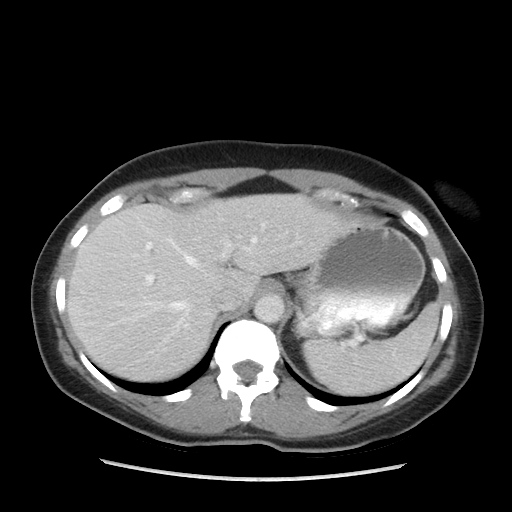
[im 64/83  lung]
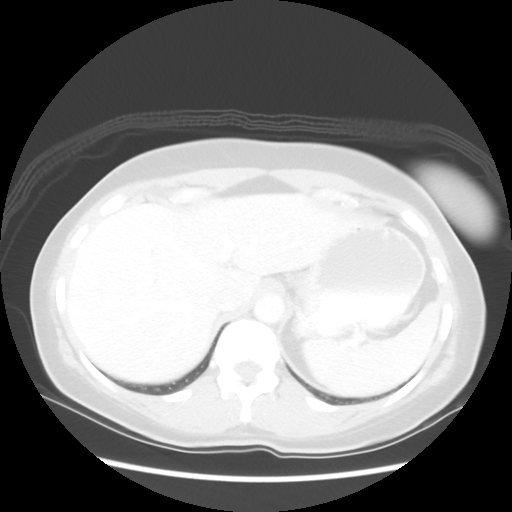
[im 73/83  soft-tissue]
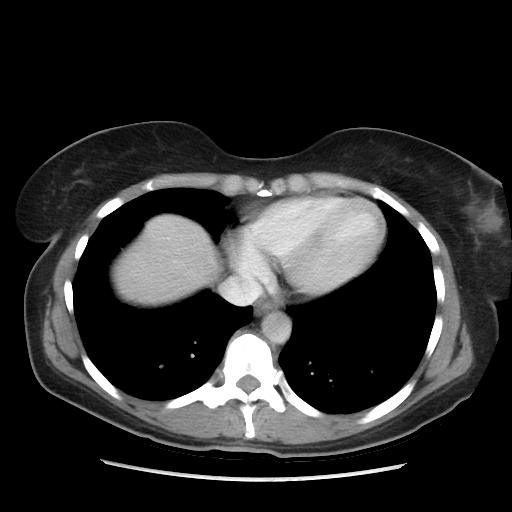
[im 73/83  lung]
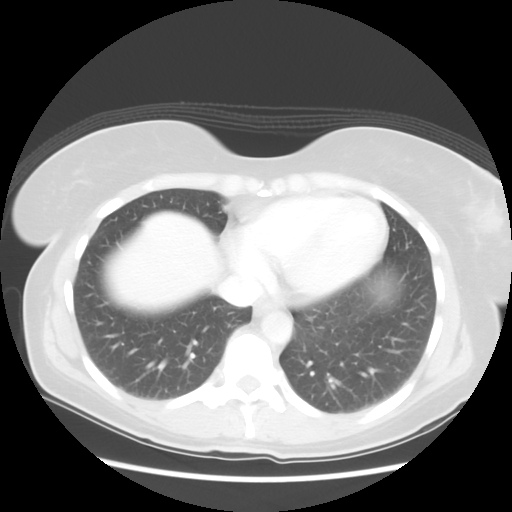

[Series 601: coronal body · coronal · 0.90mm/px · 1 of 101 slices shown, 2 images]
[im 34/101  soft-tissue]
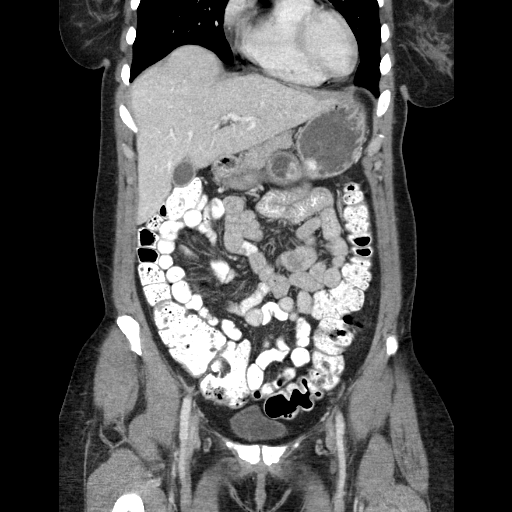
[im 34/101  bone]
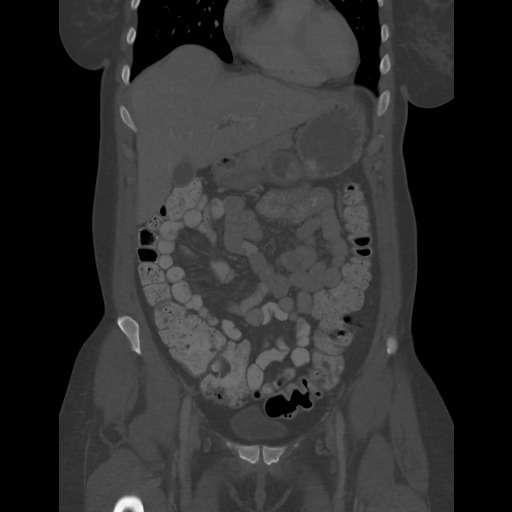

[Series 602: sagittal body · sagittal · 0.90mm/px · 4 of 145 slices shown]
[im 10/145  soft-tissue]
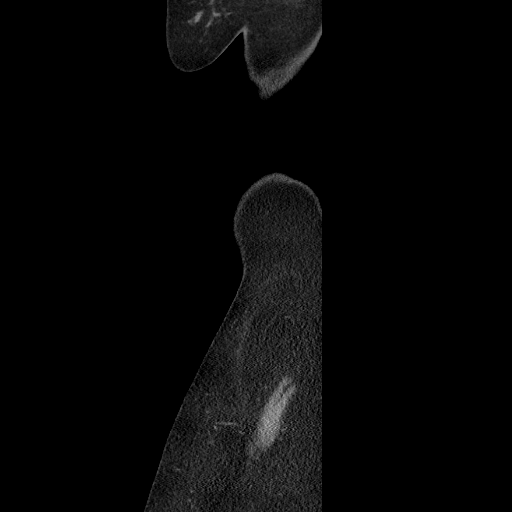
[im 29/145  soft-tissue]
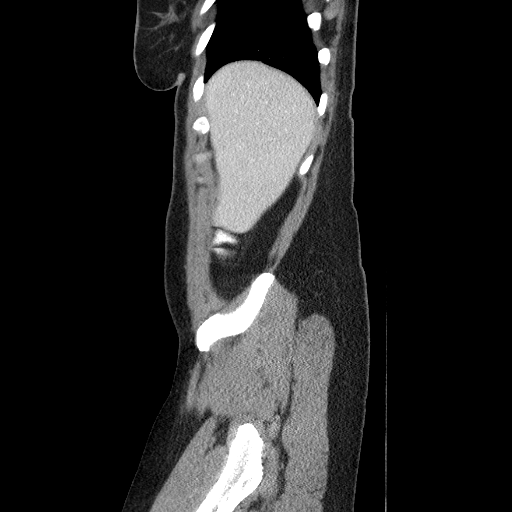
[im 49/145  soft-tissue]
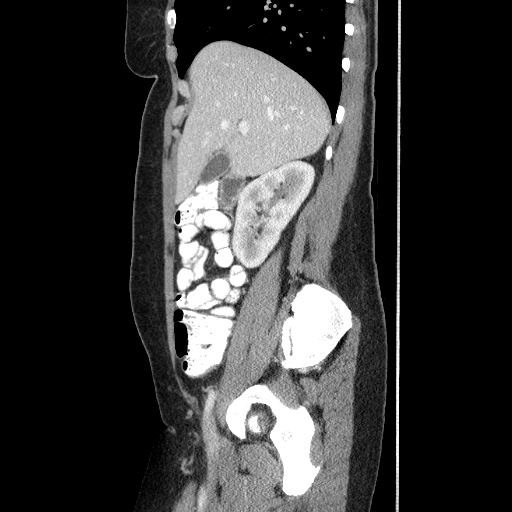
[im 68/145  soft-tissue]
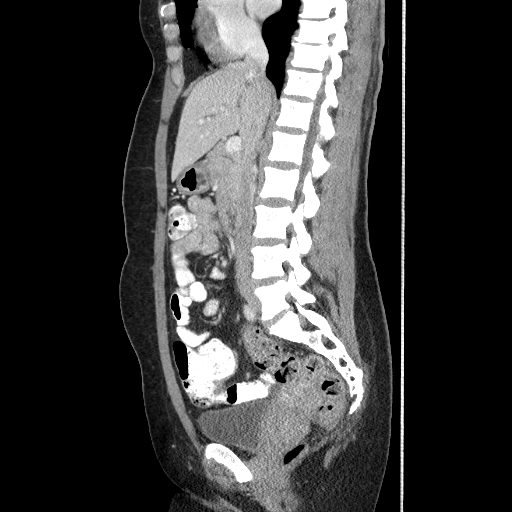

[13 of 36 positions shown; findings below may reference images not displayed]

FINDINGS: Lower chest:  Lung bases are clear.

Hepatobiliary: Liver is notable for focal fat/altered perfusion
along the falciform ligament.

Gallbladder is unremarkable. No intrahepatic or extrahepatic ductal
dilatation.

Pancreas: Within normal limits.

Spleen: Within normal limits.

Adrenals/Urinary Tract: Adrenal glands are unremarkable.

Kidneys are within normal limits.  No hydronephrosis.

Bladder is within normal limits.

Stomach/Bowel: Stomach is unremarkable.

No evidence of bowel obstruction.

Normal appendix.

Vascular/Lymphatic: No evidence of abdominal aortic aneurysm.

No suspicious abdominopelvic lymphadenopathy.

Reproductive: Retroverted uterus with suspected small uterine
fibroids, including a 10 mm fibroid in the left uterine body (series
3/image 71).

Left ovary is within normal limits.

3.4 x 2.9 cm hemorrhagic right ovarian cyst versus endometrioma
(series 3/image 74).

Other: No abdominopelvic ascites.

Musculoskeletal: Visualized osseous structures are within normal
limits.
IMPRESSION: 3.4 cm hemorrhagic right ovarian cyst versus endometrioma.

In the setting of elevated CA 125, consider short-term follow-up
pelvic MRI with/without contrast in 6-12 weeks.

## 2016-01-10 ENCOUNTER — Ambulatory Visit (INDEPENDENT_AMBULATORY_CARE_PROVIDER_SITE_OTHER): Payer: Commercial Managed Care - PPO | Admitting: Medical

## 2016-01-10 ENCOUNTER — Encounter: Payer: Self-pay | Admitting: Medical

## 2016-01-10 VITALS — BP 102/70 | HR 106 | Wt 137.0 lb

## 2016-01-10 DIAGNOSIS — F40243 Fear of flying: Secondary | ICD-10-CM

## 2016-01-10 DIAGNOSIS — F418 Other specified anxiety disorders: Secondary | ICD-10-CM

## 2016-01-10 MED ORDER — DIAZEPAM 10 MG PO TABS: ORAL_TABLET | ORAL | Status: DC

## 2016-01-10 NOTE — Progress Notes (Signed)
Subjective: Chief Complaint  Patient presents with  . Advice Only    wants to discuss medication for when flying becuase she gets so nervous and cant enjoy her trip   Here for desire to have some medication to calm nerves.  Going to [place] soon, long flight to see father.  They will be taking a scenic train ride through the [facility].   Last time she went to [place] on a long trip we gave some medication which helped.  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  Write for [employer] for [employer].  She mainly has fear of turbulence and storms while flying.   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  No other aggravating or relieving factors. No other complaint.   Past Medical History  Diagnosis Date  . Hypothyroidism   . Allergic rhinitis     dust  . Dysthymia   . Anxiety     fear of flight, public speaking  . Gastritis   . GERD (gastroesophageal reflux disease)   . Vitamin D deficiency   . Routine gynecological examination     Dr. Benjie Karvonen, pap 1/14  . H/O mammogram 2/14    ROS as in subjective   Objective:    BP 102/70 mmHg  Pulse 106  Wt 137 lb (62.143 kg)  LMP 12/29/2015  Gen: wd, wn, nad Psych: pleasant, good eye contact.      Assessment: Encounter Diagnoses  Name Primary?  . Situational anxiety Yes  . Fear of flying      Plan: Discussed her concerns.  Gave short term script for prn use.  Discussed proper use, risks/benefits of medication.  She has done well with this medication prior.  F/u soon for physical.    Kayln was seen today for advice only.  Diagnoses and all orders for this visit:  Situational anxiety  Fear of flying  Other orders -     diazepam (VALIUM) 10 MG tablet; Take 1/2 tablet PRN for flight anxiety

## 2016-01-11 ENCOUNTER — Encounter: Payer: Self-pay | Admitting: Family Medicine

## 2016-01-11 ENCOUNTER — Ambulatory Visit (INDEPENDENT_AMBULATORY_CARE_PROVIDER_SITE_OTHER): Payer: Commercial Managed Care - PPO | Admitting: Family Medicine

## 2016-01-11 VITALS — BP 100/60 | HR 72 | Ht 64.0 in | Wt 135.4 lb

## 2016-01-11 DIAGNOSIS — S0990XA Unspecified injury of head, initial encounter: Secondary | ICD-10-CM

## 2016-01-11 NOTE — Progress Notes (Signed)
Chief Complaint  Patient presents with  . Fall    had a fall this morning and her head(right side) hit bumper of a minivan. Did feel fuzzy this morning in her brain this am and her head hurt. Is feeling somewhat better now. No visual disturbances or memory issues that she can tell thus far. Leaving for one week scenic train ride trip this [place] wants to make sure she is okay to travel.    6:15 this morning, while crossing from sidewalk to street, she tripped over a lid (that covers a meter in the grass, had been out of place).   She caught the edge of the cover, tripped, fell into the street, hit the right side of her head hit the back of a minivan that was in the street (bumper) as she fell onto the street. Shoulder hit the street. She has scrapes on the right hand.  She is complaining of left sided muscle pain in her back, and headache. This morning it was the pain was on the right side of her head, but now is also having some left parietal headache.  No LOC. She iced her head and took 1000mg  of aspirin, and rested until her visit today. She is feeling a little better now.  Denies dizziness, trouble speaking, remembering Just feels a little fuzzy in her head. No nausea, vomiting. Slight discomfort with moving her head, mostly just soreness.  Works as English as a second language teacher, and thinks she won't be able to concentrate to do her job well.  Wanting to feel good about taking the rest of the day off (without feeling guilty).  She leaves in 2 days for train trip with her dad in [place].  PMH, PSH, SH reviewed  Outpatient Encounter Prescriptions as of 01/11/2016  Medication Sig Note  . fexofenadine (ALLEGRA) 30 MG tablet Take 30 mg by mouth 2 (two) times daily. Reported on 01/10/2016 04/23/2013: Prn   . SYNTHROID 75 MCG tablet TK 1 T PO ONCE A DAY 01/11/2016: Received from: External Pharmacy  . triamcinolone (NASACORT AQ) 55 MCG/ACT nasal inhaler Place 2 sprays into the nose daily. Reported on 01/10/2016  04/23/2013: Prn   . diazepam (VALIUM) 10 MG tablet Take 1/2 tablet PRN for flight anxiety (Patient not taking: Reported on 01/11/2016)   . [DISCONTINUED] levothyroxine (SYNTHROID, LEVOTHROID) 50 MCG tablet Take 50 mcg by mouth daily before breakfast.    No facility-administered encounter medications on file as of 01/11/2016.   No Known Allergies  ROS: no fever, chills, URI symptoms, sinus pain, sore throat, dizziness/vertigo, hearing or vision change. No nausea, vomiting, abdominal pain, bowel changes. No joint pains except as noted in HPI (L mid back and right shoulder). No bleeding, bruising, rash.  No other neuro symptoms, just fuzzy head.  PHYSICAL EXAM: BP 100/60 mmHg  Pulse 72  Ht 5\' 4"  (1.626 m)  Wt 135 lb 6.4 oz (61.417 kg)  BMI 23.23 kg/m2  LMP 12/29/2015  Well appearing, slightly anxious female in no distress HEENT: PERRL, EOMI, conjunctiva and sclera are clear. Fundi benign. Head is atraumatic--no bruising or soft tissue swelling noted Neck: no c-spine tenderness, no muscle spasm, FROM Back: no CVA tenderness or spinal tenderness. Tender at left rhomboid muscles Neuro: alert and oriented. Cranial nerves 2-12 intact. Normal strength, sensation, gait. Normal finger to nose, negative Romberg Extremities: no edema Psych: slightly anxious, full range of affect. Normal eye contact, speech, hygiene and grooming  ASSESSMENT/PLAN:  Closed head injury, initial encounter - very mild; some head fuzziness,  no other neuro symptoms, possible very mild concussion. DOI today   Okay to remain off work the rest of the day. Rest Head injury precautions given. stretche s shown for rhomboid Aleve prn (and sotp aspirin)

## 2016-01-11 NOTE — Patient Instructions (Signed)
Stop the aspirin and use Aleve twice daily for the muscle pain.  Apply heat and do stretches and massage as shown.  Remain out of work for the rest of the day.  Head Injury, Adult You have received a head injury. It does not appear serious at this time. Headaches and vomiting are common following head injury. It should be easy to awaken from sleeping. Sometimes it is necessary for you to stay in the emergency department for a while for observation. Sometimes admission to the hospital may be needed. After injuries such as yours, most problems occur within the first 24 hours, but side effects may occur up to 7-10 days after the injury. It is important for you to carefully monitor your condition and contact your health care provider or seek immediate medical care if there is a change in your condition. WHAT ARE THE TYPES OF HEAD INJURIES? Head injuries can be as minor as a bump. Some head injuries can be more severe. More severe head injuries include:  A jarring injury to the brain (concussion).  A bruise of the brain (contusion). This mean there is bleeding in the brain that can cause swelling.  A cracked skull (skull fracture).  Bleeding in the brain that collects, clots, and forms a bump (hematoma). WHAT CAUSES A HEAD INJURY? A serious head injury is most likely to happen to someone who is in a car wreck and is not wearing a seat belt. Other causes of major head injuries include bicycle or motorcycle accidents, sports injuries, and falls. HOW ARE HEAD INJURIES DIAGNOSED? A complete history of the event leading to the injury and your current symptoms will be helpful in diagnosing head injuries. Many times, pictures of the brain, such as CT or MRI are needed to see the extent of the injury. Often, an overnight hospital stay is necessary for observation.  WHEN SHOULD I SEEK IMMEDIATE MEDICAL CARE?  You should get help right away if:  You have confusion or drowsiness.  You feel sick to your  stomach (nauseous) or have continued, forceful vomiting.  You have dizziness or unsteadiness that is getting worse.  You have severe, continued headaches not relieved by medicine. Only take over-the-counter or prescription medicines for pain, fever, or discomfort as directed by your health care provider.  You do not have normal function of the arms or legs or are unable to walk.  You notice changes in the black spots in the center of the colored part of your eye (pupil).  You have a clear or bloody fluid coming from your nose or ears.  You have a loss of vision. During the next 24 hours after the injury, you must stay with someone who can watch you for the warning signs. This person should contact local emergency services (911 in the [place] if you have seizures, you become unconscious, or you are unable to wake up. HOW CAN I PREVENT A HEAD INJURY IN THE FUTURE? The most important factor for preventing major head injuries is avoiding motor vehicle accidents. To minimize the potential for damage to your head, it is crucial to wear seat belts while riding in motor vehicles. Wearing helmets while bike riding and playing collision sports (like football) is also helpful. Also, avoiding dangerous activities around the house will further help reduce your risk of head injury.  WHEN CAN I RETURN TO NORMAL ACTIVITIES AND ATHLETICS? You should be reevaluated by your health care provider before returning to these activities. If you have any of  the following symptoms, you should not return to activities or contact sports until 1 week after the symptoms have stopped:  Persistent headache.  Dizziness or vertigo.  Poor attention and concentration.  Confusion.  Memory problems.  Nausea or vomiting.  Fatigue or tire easily.  Irritability.  Intolerant of bright lights or loud noises.  Anxiety or depression.  Disturbed sleep. MAKE SURE YOU:   Understand these instructions.  Will watch your  condition.  Will get help right away if you are not doing well or get worse.   This information is not intended to replace advice given to you by your health care provider. Make sure you discuss any questions you have with your health care provider.   Document Released: 08/05/2005 Document Revised: 08/26/2014 Document Reviewed: 04/12/2013 Elsevier Interactive Patient Education [employer].

## 2016-01-24 ENCOUNTER — Encounter: Payer: Self-pay | Admitting: Gynecologic Oncology

## 2016-01-24 ENCOUNTER — Ambulatory Visit: Payer: Commercial Managed Care - PPO | Attending: Gynecologic Oncology | Admitting: Gynecologic Oncology

## 2016-01-24 VITALS — BP 118/76 | HR 62 | Temp 98.0°F | Resp 18 | Ht 64.0 in | Wt 138.0 lb

## 2016-01-24 DIAGNOSIS — J309 Allergic rhinitis, unspecified: Secondary | ICD-10-CM | POA: Insufficient documentation

## 2016-01-24 DIAGNOSIS — N83201 Unspecified ovarian cyst, right side: Secondary | ICD-10-CM | POA: Diagnosis present

## 2016-01-24 DIAGNOSIS — E559 Vitamin D deficiency, unspecified: Secondary | ICD-10-CM | POA: Diagnosis not present

## 2016-01-24 DIAGNOSIS — E039 Hypothyroidism, unspecified: Secondary | ICD-10-CM | POA: Diagnosis not present

## 2016-01-24 DIAGNOSIS — R971 Elevated cancer antigen 125 [CA 125]: Secondary | ICD-10-CM

## 2016-01-24 DIAGNOSIS — F341 Dysthymic disorder: Secondary | ICD-10-CM | POA: Diagnosis not present

## 2016-01-24 DIAGNOSIS — F419 Anxiety disorder, unspecified: Secondary | ICD-10-CM | POA: Insufficient documentation

## 2016-01-24 DIAGNOSIS — K219 Gastro-esophageal reflux disease without esophagitis: Secondary | ICD-10-CM | POA: Diagnosis not present

## 2016-01-24 NOTE — Patient Instructions (Signed)
Plan to have a CA 125 in six weeks time then another 6 weeks after that.  Please call if you would like to proceed with surgery.  Please call for any questions or concerns.

## 2016-01-24 NOTE — Progress Notes (Signed)
Consult Note: Gyn-Onc  Consult was requested by Dr. Benjie Karvonen for the evaluation of Colleen Berry 50 y.o. female  CC:  Chief Complaint  Patient presents with  . right ovarian cyst, elevated CA 125    Assessment/Plan:  Colleen Berry  is a 50 y.o.  year old with a 3cm complex right ovarian cyst and elevated CA 125 (120).  She has had a fairly stable cyst appearance over several years, but her CA 125 doubled in the past year. The cyst's features are most consistent with endometrioma. It is asymptomatic. She is healthy and thin and a low surgical risk.  I discussed with Colleen Berry that only surgical pathology would establish a definitive diagnosis, but overall my suspicion for malignancy is low. I stated that to be definitive we could perform a laparoscopic RSO, with frozen section, and possible staging if malignancy is identified. The patient would prefer to obtain a few more CA 125 assessments to monitor for a trend, as she too feels that she is unlikely to have cancer, and would prefer to avoid the recovery from surgery.  I think this is a reasonable plan.  I am recommending repeat CA 125 at 6 week intervals x 2.  If these show a consistent trend upwards, I would recommend surgery. If, however, they remain stable or drop, and the patient continues to desire expectant management, then annual or 6 monthly surveillance is appropriate.  I told the patient that if she changes her mind and desires surgical intervention she should let me know and I would be happy to intervene. She will follow-up with Dr Gardiner Coins office for the CA 125 redraws as these are best collected and compared from the same lab.  HPI: Colleen Berry is a 50 year old G0 who is seen in consultation at the request of Dr Benjie Karvonen for a complex right ovarian cyst and elevated CA 125. The patient is premenopausal and still cycling regularly. She had an incidental finding of a right ovarian cyst in November 2015 after routine physical  examination with her primary care physician detected enlarged ovary. This prompted an ultrasound scan in November 2015 which revealed a right ovarian hemorrhagic cyst measuring 4.2 x 3.3 cm. CA-125 at that time was 80. A repeat ultrasound performed in January 2016 and demonstrated persistence of the right ovarian cyst though slightly smaller at 3.2 x 2.8 cm. It was suspected to be an endometrioma. Repeat CA-125 that time had decreased to 60. A follow-up ultrasound scan in March 2016 showed persistence of the right ovarian cyst measuring 3.3 x 2.6 x 3.1 cm with similar features. CA-125 was essentially unchanged at 62. Most recently she underwent an ultrasound scan in May 2017 which demonstrated a complex cystic right ovarian mass with low-level echoes and no solid areas measuring 3.2 x 2.8 x 2.4 cm. There was no increased blood flow. There is also a left ovarian follicle measuring 2.2 cm. She's too small uterine fibroids the largest of which measures 6.5 cm. His CA-125 was drawn on 01/05/2016 and was elevated at 159.  The patient is otherwise very healthy. She suffers from chronic fatigue syndrome but takes no medications for this. She feels that she has a weakened immune system and easily catches cold flus. Her past family history is significant for a first degree cousin who died from ovarian cancer in her early 49s. She is not aware of a deleterious mutation in BRCA.  Patient otherwise feels very well and denies symptoms of abdominal bloating, consistent pelvic  pain, change in menstrual cycle, or early satiety. Her only prior surgery is a laparoscopic ovarian cystectomy performed remotely at the time of egg freezing.   Current Meds:  Outpatient Encounter Prescriptions as of 01/24/2016  Medication Sig  . diazepam (VALIUM) 10 MG tablet Take 1/2 tablet PRN for flight anxiety  . fexofenadine (ALLEGRA) 30 MG tablet Take 30 mg by mouth 2 (two) times daily. Reported on 01/10/2016  . SYNTHROID 75 MCG tablet TK 1 T  PO ONCE A DAY  . triamcinolone (NASACORT AQ) 55 MCG/ACT nasal inhaler Place 2 sprays into the nose daily. Reported on 01/10/2016   No facility-administered encounter medications on file as of 01/24/2016.    Allergy: No Known Allergies  Social Hx:   Social History   Social History  . Marital Status: Single    Spouse Name: N/A  . Number of Children: N/A  . Years of Education: N/A   Occupational History  . Not on file.   Social History Main Topics  . Smoking status: Never Smoker   . Smokeless tobacco: Not on file  . Alcohol Use: 2.4 oz/week    4 Glasses of wine per week  . Drug Use: No  . Sexual Activity: Not on file   Other Topics Concern  . 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 Knee surgery      multiple    Past Medical Hx:  Past Medical History  Diagnosis Date  . Hypothyroidism   . Allergic rhinitis     dust  . Dysthymia   . Anxiety     fear of flight, public speaking  . Gastritis   . GERD (gastroesophageal reflux disease)   . Vitamin D deficiency   . Routine gynecological examination     Dr. Benjie Karvonen, pap 1/14  . H/O mammogram 2/14    Past Gynecological History:  G0. Laparoscopic ovarian cystectomy  Patient's last menstrual period was 12/29/2015.  Family Hx:  Family History  Problem Relation Age of Onset  . Cancer Mother     myelodysplasia  . Pneumonia Mother     died of pneumonia    Review of Systems:  Constitutional  Feels well,    ENT Normal appearing ears and nares bilaterally Skin/Breast  No rash, sores, jaundice, itching, dryness Cardiovascular  No chest pain, shortness of breath, or edema  Pulmonary  No cough or wheeze.  Gastro Intestinal  No nausea, vomitting, or diarrhoea. No bright red blood per rectum, no abdominal pain, change in bowel movement, or constipation.  Genito Urinary  No  frequency, urgency, dysuria, no intermenstrual bleeding Musculo Skeletal  No myalgia, arthralgia, joint swelling or pain  Neurologic  No weakness, numbness, change in gait,  Psychology  No depression, anxiety, insomnia.   Vitals:  Blood pressure 118/76, pulse 62, temperature 98 F (36.7 C), temperature source Oral, resp. rate 18, height '5\' 4"'  (1.626 m), weight 138 lb (62.596 kg), last menstrual period 12/29/2015, SpO2 100 %.  Physical Exam: WD in NAD Neck  Supple NROM, without any enlargements.  Lymph Node Survey No cervical supraclavicular or inguinal adenopathy Cardiovascular  Pulse normal rate, regularity and rhythm. S1 and S2 normal.  Lungs  Clear to auscultation bilateraly, without wheezes/crackles/rhonchi. Good air movement.  Skin  No rash/lesions/breakdown  Psychiatry  Alert and oriented to person, place, and  time  Abdomen  Normoactive bowel sounds, abdomen soft, non-tender and thin without evidence of hernia.  Back No CVA tenderness Genito Urinary  Vulva/vagina: Normal external female genitalia.  No lesions. No discharge or bleeding.  Bladder/urethra:  No lesions or masses, well supported bladder  Vagina: normal  Cervix: Normal appearing, no lesions.  Uterus: Slightly globularly enlarged, mobile, no parametrial involvement or nodularity.  Adnexa: no palpable masses. Rectal  deferred Extremities  No bilateral cyanosis, clubbing or edema.   Donaciano Eva, MD  01/24/2016, 12:10 PM

## 2016-01-30 ENCOUNTER — Encounter: Payer: Self-pay | Admitting: Medical

## 2016-01-30 ENCOUNTER — Ambulatory Visit (INDEPENDENT_AMBULATORY_CARE_PROVIDER_SITE_OTHER): Payer: Commercial Managed Care - PPO | Admitting: Medical

## 2016-01-30 VITALS — BP 98/60 | HR 71 | Temp 98.2°F | Wt 137.0 lb

## 2016-01-30 DIAGNOSIS — J988 Other specified respiratory disorders: Secondary | ICD-10-CM

## 2016-01-30 MED ORDER — AMOXICILLIN 875 MG PO TABS: 875.0000 mg | ORAL_TABLET | Freq: Two times a day (BID) | ORAL | Status: DC

## 2016-01-30 NOTE — Progress Notes (Signed)
Subjective: Chief Complaint  Patient presents with  . sick?    started a week ago sunday. said that last thursday she said it hit her harder and she was unable to work. she is extremly tired. sore throat. cough. congestion.    Here for illness.   Started 8 days ago with tickle in throat, had to leave work the next day.   Has had cough, head congestion, lots of mucous in nose.  Has had subjective low grade fever.  Has had sneezing, bad sore , extreme exhaustion.  Doesn't seem to be getting better.   Denies NVD, no SOB, no wheezing, no abdominal or back pain.  No rash.  Using OTC analgesic, Nyquil.  Has had some sick contacts.  Was on recent long rail vacation, so around lots of other people.  No other aggravating or relieving factors. No other complaint.  Past Medical History  Diagnosis Date  . Hypothyroidism   . Allergic rhinitis     dust  . Dysthymia   . Anxiety     fear of flight, public speaking  . Gastritis   . GERD (gastroesophageal reflux disease)   . Vitamin D deficiency   . Routine gynecological examination     Dr. Benjie Karvonen, pap 1/14  . H/O mammogram 2/14   ROS as in subjective   Objective: BP 98/60 mmHg  Pulse 71  Temp(Src) 98.2 F (36.8 C) (Tympanic)  Wt 137 lb (62.143 kg)  LMP 01/22/2016  General appearance: alert, no distress, WD/WN, somewhat ill appearing HEENT: normocephalic, sclerae anicteric, TMs flat, nares patent,mucoid discharge, mild erythema, pharynx normal Oral cavity: MMM, no lesions Neck: supple, no lymphadenopathy, no thyromegaly, no masses Heart: RRR, normal S1, S2, no murmurs Lungs: mildly coarse sounds, otherwise clear, no wheezes, rhonchi, or rales Pulses: 2+ symmetric, upper and lower extremities, normal cap refill    Assessment: Encounter Diagnosis  Name Primary?  Marland Kitchen Respiratory tract infection Yes    Plan: Advised rest, hydration, discussed supportive care. If not seeing improvement within 48 hours, then begin amoxicillin.  F/u  prn.

## 2016-03-30 ENCOUNTER — Encounter (HOSPITAL_COMMUNITY): Payer: Self-pay | Admitting: Emergency Medicine

## 2016-03-30 ENCOUNTER — Emergency Department (HOSPITAL_COMMUNITY)
Admission: EM | Admit: 2016-03-30 | Discharge: 2016-03-30 | Disposition: A | Discharge status: Home / Self Care | Payer: Commercial Managed Care - PPO | Attending: Dermatology | Admitting: Dermatology

## 2016-03-30 DIAGNOSIS — R509 Fever, unspecified: Secondary | ICD-10-CM | POA: Diagnosis not present

## 2016-03-30 DIAGNOSIS — Z5321 Procedure and treatment not carried out due to patient leaving prior to being seen by health care provider: Secondary | ICD-10-CM | POA: Insufficient documentation

## 2016-03-30 NOTE — ED Notes (Signed)
Called pt back to triage x1 for blood draw with no response

## 2016-03-30 NOTE — ED Triage Notes (Signed)
Patient states that she is supposed to be seeing oncology surgeon on Monday due to ovary.  Patient states that she was told if fever then seek medical attention.  Patient temp at home was 101 and states her baseline 97.5, patient having diarrhea abd pain and chills.  Patient denies taking anything for fever on ly 1 imodium.

## 2016-03-30 NOTE — ED Notes (Signed)
Called pt back to room x1 with no response

## 2016-04-01 ENCOUNTER — Ambulatory Visit: Payer: Commercial Managed Care - PPO | Attending: Gynecology | Admitting: Gynecology

## 2016-04-01 VITALS — BP 107/73 | HR 98 | Temp 98.1°F | Resp 18 | Wt 137.0 lb

## 2016-04-01 DIAGNOSIS — Z79899 Other long term (current) drug therapy: Secondary | ICD-10-CM | POA: Insufficient documentation

## 2016-04-01 DIAGNOSIS — K219 Gastro-esophageal reflux disease without esophagitis: Secondary | ICD-10-CM | POA: Insufficient documentation

## 2016-04-01 DIAGNOSIS — E039 Hypothyroidism, unspecified: Secondary | ICD-10-CM | POA: Diagnosis not present

## 2016-04-01 DIAGNOSIS — R971 Elevated cancer antigen 125 [CA 125]: Secondary | ICD-10-CM

## 2016-04-01 DIAGNOSIS — N83201 Unspecified ovarian cyst, right side: Secondary | ICD-10-CM

## 2016-04-01 NOTE — Progress Notes (Signed)
Consult Note: Gyn-Onc   Colleen Berry 50 y.o. female  Chief Complaint  Patient presents with  . Ovarian Cyst    Assessment :  Right ovarian cyst with a rising CA-125 value.  Plan: Given the consistent increase in her CA-125 value, I would recommend laparoscopic removal of the right ovary and cyst for pathologic confirmation. She will have an ultrasound tomorrow and Dr. Gardiner Coins office. The patient is given several potential dates for surgery that could be performed at [facility] (at her request). She will contact me with her chosen date and we will arrange for preoperative evaluation at [facility].  The patient understands and intraoperative frozen section will be obtained at this is a malignancy we will proceed with exploratory laparotomy and full surgical staging including omentectomy pelvic and aortic lymphadenectomy total abdominal hysterectomy and bilateral salpingo-oophorectomy.  HPI:  50 y.o. female  Chief Complaint returns for continuing follow-up of a right ovarian cyst and elevated CA-125. She was seen by Dr. Denman George in June at which time theCA-125 value was 159 units per mL and the right cystic lesion measured 3.2 x 2.8 x 2.4 cm. It is described as complex with low level echo was but no solid areas. Patient was also noted to have uterine fibroids.  Patient continues to have regular cyclic menses and her last period was partially 2 weeks ago.  Review of the patient's chart notes that she has had an ovarian cyst and elevated CA-125 dating back to November 2015. At that time CA-125 is 80 units per mL. On 2 occasions in 2016 CA-125 values were 60 and 62 units per mL. May 2017 CA-125 risen to 159 units per mL. Most recently on 03/01/2016 the CA-125 was 145 units per mL and a repeat on 03/22/2016 returned as 192 units per mL. The patient claims that she is entirely asymptomatic. She specifically denies any pelvic pain pressure or any other GI or GU symptoms. She did have a flare of diarrhea and a temperature of 102  yesterday and was seen in the emergency room. She's had no fever since and feels better.  The patient has a maternal cousin who died of ovarian cancer. There is no other family history of ovarian or breast cancer.  Review of Systems:10 point review of systems is negative except as noted in interval history.   Vitals: Blood pressure 107/73, pulse 98, temperature 98.1 F (36.7 C), temperature source Oral, resp. rate 18, weight 137 lb (62.1 kg), SpO2 100 %.  Physical Exam: General : The patient is a healthy woman in no acute distress.  HEENT: normocephalic, extraoccular movements normal; neck is supple without thyromegally  Lynphnodes: Supraclavicular and inguinal nodes not enlarged  Abdomen: Soft, non-tender, no ascites, no organomegally, no masses, no hernias  Pelvic:  EGBUS: Normal female  Vagina: Normal, no lesions  Urethra and Bladder: Normal, non-tender  Cervix: Normal Uterus: Retroverted normal shape size and consistency.  Bi-manual examination: Non-tender; no adenxal masses or nodularity  Rectal: normal sphincter tone, no masses, no blood  Lower extremities: No edema or varicosities. Normal range of motion      No Known Allergies  Past Medical History:  Diagnosis Date  . Allergic rhinitis    dust  . Anxiety    fear of flight, public speaking  . Dysthymia   . Gastritis   . GERD (gastroesophageal reflux disease)   . H/O mammogram 2/14  . Hypothyroidism   . Routine gynecological examination    Dr. Benjie Karvonen, pap 1/14  . Vitamin D deficiency  Past Surgical History:  Procedure Laterality Date  . KNEE SURGERY     multiple    Current Outpatient Prescriptions  Medication Sig Dispense Refill  . fexofenadine (ALLEGRA) 30 MG tablet Take 30 mg by mouth 2 (two) times daily. Reported on 01/30/2016    . SYNTHROID 75 MCG tablet TK 1 T PO ONCE A DAY  5  . triamcinolone (NASACORT AQ) 55 MCG/ACT nasal inhaler Place 2 sprays into the nose daily. Reported on 01/30/2016    .  amoxicillin (AMOXIL) 875 MG tablet Take 1 tablet (875 mg total) by mouth 2 (two) times daily. (Patient not taking: Reported on 04/01/2016) 20 tablet 0   No current facility-administered medications for this visit.     Social History   Social History  . Marital status: Single    Spouse name: N/A  . Number of children: N/A  . Years of education: N/A   Occupational History  . Not on file.   Social History Main Topics  . Smoking status: Never Smoker  . Smokeless tobacco: Not on file  . Alcohol use 2.4 oz/week    4 Glasses of wine per week  . Drug use: No  . Sexual activity: Not on file   Other Topics Concern  . 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 Cancer Mother     myelodysplasia  . Pneumonia Mother     died of pneumonia      Marti Sleigh, MD 04/01/2016, 10:45 AM

## 2016-04-01 NOTE — Patient Instructions (Signed)
We will contact you with a date and time for your surgery at [facility].  We will also arrange for you to have a pre-op appointment.

## 2016-04-02 ENCOUNTER — Telehealth: Payer: Self-pay | Admitting: Gynecologic Oncology

## 2016-04-02 NOTE — Telephone Encounter (Signed)
Returned call to patient.  Asking about having surgery at [facility] between 5-15 of Sept.  Left message for Katharine Look, surgery scheduler at [facility].

## 2016-04-04 ENCOUNTER — Encounter: Payer: Self-pay | Admitting: Gynecologic Oncology

## 2016-04-04 NOTE — Progress Notes (Signed)
Colleen Berry is scheduled for surgery with Dr. Wayland Salinas 9/26 at [facility], pre-op 9/15

## 2016-04-15 ENCOUNTER — Other Ambulatory Visit: Payer: Self-pay | Admitting: Medical

## 2016-04-15 ENCOUNTER — Telehealth: Payer: Self-pay | Admitting: Medical

## 2016-04-15 MED ORDER — ALPRAZOLAM 0.5 MG PO TABS: 0.5000 mg | ORAL_TABLET | Freq: Every evening | ORAL | 0 refills | Status: DC | PRN

## 2016-04-15 NOTE — Telephone Encounter (Signed)
Mailbox full but phoned in xanax

## 2016-04-15 NOTE — Telephone Encounter (Signed)
Call in #10 xanax.  I don't need to see here for this specifically

## 2016-04-15 NOTE — Telephone Encounter (Signed)
Pt called and was wondering if you would send her something in to help calm and re lex her when she fly's she states that you have seen and prescribed her something previously for this, wants to know if she needs to make a appt for this of if you can just call her something in, pt did make a appt for tomorrow. Pt would like to know asap if she needs to cancel this appt. Pt uses [id number] - [place], [address] [place] and pt  Can be reached at [phone]

## 2016-04-16 ENCOUNTER — Institutional Professional Consult (permissible substitution): Payer: Commercial Managed Care - PPO | Admitting: Medical

## 2016-04-16 NOTE — Telephone Encounter (Signed)
Juliann Pulse called pt to inform her that her appt was not needed and canceled

## 2016-06-05 ENCOUNTER — Ambulatory Visit (INDEPENDENT_AMBULATORY_CARE_PROVIDER_SITE_OTHER): Payer: Commercial Managed Care - PPO | Admitting: Medical

## 2016-06-05 ENCOUNTER — Encounter: Payer: Self-pay | Admitting: Medical

## 2016-06-05 VITALS — BP 112/70 | HR 65 | Temp 98.1°F | Ht 64.0 in

## 2016-06-05 DIAGNOSIS — F410 Panic disorder [episodic paroxysmal anxiety] without agoraphobia: Secondary | ICD-10-CM

## 2016-06-05 DIAGNOSIS — R971 Elevated cancer antigen 125 [CA 125]: Secondary | ICD-10-CM

## 2016-06-05 DIAGNOSIS — F40243 Fear of flying: Secondary | ICD-10-CM | POA: Diagnosis not present

## 2016-06-05 DIAGNOSIS — N83209 Unspecified ovarian cyst, unspecified side: Secondary | ICD-10-CM | POA: Diagnosis not present

## 2016-06-05 DIAGNOSIS — Z789 Other specified health status: Secondary | ICD-10-CM

## 2016-06-05 MED ORDER — ALPRAZOLAM 0.5 MG PO TABS: 0.5000 mg | ORAL_TABLET | Freq: Every evening | ORAL | 1 refills | Status: DC | PRN

## 2016-06-05 MED ORDER — DIAZEPAM 10 MG PO TABS: ORAL_TABLET | ORAL | 1 refills | Status: DC

## 2016-06-05 NOTE — Progress Notes (Signed)
Subjective: Chief Complaint  Patient presents with  . Follow-up   Here for travel concerns.   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  The turbulence really scares here.  Wants to have supply of medication for these flights.   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  This past year she has done well on the combination of a valium taken before flights, and then Xanax if the ride gets bumpy or heavy turbulence.   Usually valium alone works great and doesn't completely sedate her.  She does well on this combo.  Of note, she will be having surgery soon to remove ovarian cyst at [facility] given the cyst and increasing CA 125.   Past Medical History:  Diagnosis Date  . Allergic rhinitis    dust  . Anxiety    fear of flight, public speaking  . Dysthymia   . Gastritis   . GERD (gastroesophageal reflux disease)   . H/O mammogram 2/14  . Hypothyroidism   . Routine gynecological examination    Dr. Benjie Karvonen, pap 1/14  . Vitamin D deficiency    Current Outpatient Prescriptions on File Prior to Visit  Medication Sig Dispense Refill  . SYNTHROID 75 MCG tablet TK 1 T PO ONCE A DAY  5  . fexofenadine (ALLEGRA) 30 MG tablet Take 30 mg by mouth 2 (two) times daily. Reported on 01/30/2016    . triamcinolone (NASACORT AQ) 55 MCG/ACT nasal inhaler Place 2 sprays into the nose daily. Reported on 01/30/2016     No current facility-administered medications on file prior to visit.    ROS as in subjective    Objective: BP 112/70 (BP Location: Left Arm, Patient Position: Sitting, Cuff Size: Normal)   Pulse 65   Temp 98.1 F (36.7 C) (Oral)   Ht 5\' 4"  (1.626 m)   SpO2 98%   Gen: wd, wn, nad Psych: pleasant,normal affect    Assessment: Encounter Diagnoses  Name Primary?  . Fear of flying Yes  . Hx of foreign travel   . Panic attack   . Cyst of ovary, unspecified laterality   .  Elevated CA-125      Plan: Can use valium for long flights, xanax prn, but avoid concomitant use of alcohol, antihistamines or other sedating drugs.    F/u with gynecology for surgery as planned  Karole was seen today for follow-up.  Diagnoses and all orders for this visit:  Fear of flying  Hx of foreign travel  Panic attack  Cyst of ovary, unspecified laterality  Elevated CA-125  Other orders -     diazepam (VALIUM) 10 MG tablet; Take 1/2 -1 tablet PRN for flight anxiety -     ALPRAZolam (XANAX) 0.5 MG tablet; Take 1 tablet (0.5 mg total) by mouth at bedtime as needed for anxiety.

## 2016-08-02 ENCOUNTER — Telehealth: Payer: Self-pay | Admitting: *Deleted

## 2016-08-02 NOTE — Telephone Encounter (Signed)
Pt requesting f/u with Dr.CP in [place] instead of [facility] appointment made instructed pt to call [facility] to cancel 1/18 appt

## 2016-08-26 ENCOUNTER — Ambulatory Visit: Payer: Commercial Managed Care - PPO | Attending: Gynecology | Admitting: Gynecology

## 2016-08-26 ENCOUNTER — Encounter: Payer: Self-pay | Admitting: Gynecology

## 2016-08-26 VITALS — BP 130/80 | HR 57 | Temp 97.8°F | Resp 18 | Ht 64.0 in | Wt 139.3 lb

## 2016-08-26 DIAGNOSIS — E559 Vitamin D deficiency, unspecified: Secondary | ICD-10-CM | POA: Insufficient documentation

## 2016-08-26 DIAGNOSIS — Z808 Family history of malignant neoplasm of other organs or systems: Secondary | ICD-10-CM | POA: Diagnosis not present

## 2016-08-26 DIAGNOSIS — Z8489 Family history of other specified conditions: Secondary | ICD-10-CM | POA: Diagnosis not present

## 2016-08-26 DIAGNOSIS — N83201 Unspecified ovarian cyst, right side: Secondary | ICD-10-CM | POA: Insufficient documentation

## 2016-08-26 DIAGNOSIS — E039 Hypothyroidism, unspecified: Secondary | ICD-10-CM | POA: Insufficient documentation

## 2016-08-26 DIAGNOSIS — D259 Leiomyoma of uterus, unspecified: Secondary | ICD-10-CM | POA: Diagnosis not present

## 2016-08-26 DIAGNOSIS — K219 Gastro-esophageal reflux disease without esophagitis: Secondary | ICD-10-CM | POA: Insufficient documentation

## 2016-08-26 DIAGNOSIS — F419 Anxiety disorder, unspecified: Secondary | ICD-10-CM | POA: Insufficient documentation

## 2016-08-26 DIAGNOSIS — Z9889 Other specified postprocedural states: Secondary | ICD-10-CM | POA: Diagnosis not present

## 2016-08-26 DIAGNOSIS — N801 Endometriosis of ovary: Secondary | ICD-10-CM

## 2016-08-26 DIAGNOSIS — N80129 Deep endometriosis of ovary, unspecified ovary: Secondary | ICD-10-CM | POA: Insufficient documentation

## 2016-08-26 DIAGNOSIS — R197 Diarrhea, unspecified: Secondary | ICD-10-CM | POA: Diagnosis not present

## 2016-08-26 DIAGNOSIS — N809 Endometriosis, unspecified: Secondary | ICD-10-CM | POA: Diagnosis present

## 2016-08-26 DIAGNOSIS — Z79899 Other long term (current) drug therapy: Secondary | ICD-10-CM | POA: Insufficient documentation

## 2016-08-26 NOTE — Patient Instructions (Signed)
Follow up with Dr. Genia Harold. Call our office with any concerns.

## 2016-08-26 NOTE — Progress Notes (Signed)
Consult Note: Gyn-Onc   Colleen Berry 51 y.o. female  No chief complaint on file.   Assessment :  Status post laparoscopic right salpingo-oophorectomy and left salpingectomy 07/23/2016 at [facility]. Final pathology showed endometrioma. Excellent postoperative recovery.  Mild hot flushes well tolerated. Some disrupted sleep which is improving. No menstrual periods since surgery.  Plan:  . Patient is given the okay to return to full levels of activity. We discussed the surgery surgical findings of final pathology. Patient has some concerns regarding sleep difficulties although she says this is improved over the past week. She has some mild hot flushes which she tolerates well. She does have some concerns regarding whether her remaining ovary will continue to function. I recommended that she return to see Dr. Benjie Karvonen in 3 months. If at that time she is not had any menstrual periods it may be worth checking a serum FSH. Certainly the patient is in the menopausal age group despite having regular periods up until the time of her recent surgery.  HPI:  51 year old  white female returns for continuing follow-up of a right ovarian cyst and elevated CA-125. She was seen by Dr. Denman George in June at which time theCA-125 value was 159 units per mL and the right cystic lesion measured 3.2 x 2.8 x 2.4 cm. It is described as complex with low level echo was but no solid areas. Patient was also noted to have uterine fibroids.  Patient continues to have regular cyclic menses and her last period was partially 2 weeks ago.  Review of the patient's chart notes that she has had an ovarian cyst and elevated CA-125 dating back to November 2015. At that time CA-125 is 80 units per mL. On 2 occasions in 2016 CA-125 values were 60 and 62 units per mL. May 2017 CA-125 risen to 159 units per mL. Most recently on 03/01/2016 the CA-125 was 145 units per mL and a repeat on 03/22/2016 returned as 192 units per mL. The patient claims  that she is entirely asymptomatic. She specifically denies any pelvic pain pressure or any other GI or GU symptoms. She did have a flare of diarrhea and a temperature of 102 yesterday and was seen in the emergency room. She's had no fever since and feels better.  The patient has a maternal cousin who died of ovarian cancer. There is no other family history of ovarian or breast cancer.  Status post laparoscopic right salpingo-oophorectomy and left salpingectomy 07/23/2016 at [facility]. Final pathology showed endometrioma.  Review of Systems:10 point review of systems is negative except as noted in interval history.   Vitals: There were no vitals taken for this visit.  Physical Exam: General : The patient is a healthy woman in no acute distress.  HEENT: normocephalic, extraoccular movements normal; neck is supple without thyromegally  Lynphnodes: Supraclavicular and inguinal nodes not enlarged  Abdomen: Soft, non-tender, no ascites, no organomegally, no masses, no hernias. All laparoscopic incisions are well-healed.   Lower extremities: No edema or varicosities. Normal range of motion      No Known Allergies  Past Medical History:  Diagnosis Date  . Allergic rhinitis    dust  . Anxiety    fear of flight, public speaking  . Dysthymia   . Gastritis   . GERD (gastroesophageal reflux disease)   . H/O mammogram 2/14  . Hypothyroidism   . Routine gynecological examination    Dr. Benjie Karvonen, pap 1/14  . Vitamin D deficiency     Past  Surgical History:  Procedure Laterality Date  . KNEE SURGERY     multiple    Current Outpatient Prescriptions  Medication Sig Dispense Refill  . ALPRAZolam (XANAX) 0.5 MG tablet Take 1 tablet (0.5 mg total) by mouth at bedtime as needed for anxiety. 30 tablet 1  . diazepam (VALIUM) 10 MG tablet Take 1/2 -1 tablet PRN for flight anxiety 20 tablet 1  . fexofenadine (ALLEGRA) 30 MG tablet Take 30 mg by mouth 2 (two) times daily. Reported on 01/30/2016     . SYNTHROID 75 MCG tablet TK 1 T PO ONCE A DAY  5  . triamcinolone (NASACORT AQ) 55 MCG/ACT nasal inhaler Place 2 sprays into the nose daily. Reported on 01/30/2016     No current facility-administered medications for this visit.     Social History   Social History  . Marital status: Single    Spouse name: N/A  . Number of children: N/A  . Years of education: N/A   Occupational History  . Not on file.   Social History Main Topics  . Smoking status: Never Smoker  . Smokeless tobacco: Never Used  . Alcohol use 2.4 oz/week    4 Glasses of wine per week  . Drug use: No  . Sexual activity: Not on file   Other Topics Concern  . 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 Cancer Mother     myelodysplasia  . Pneumonia Mother     died of pneumonia      Marti Sleigh, MD 08/26/2016, 10:04 AM

## 2016-10-02 DIAGNOSIS — Z01419 Encounter for gynecological examination (general) (routine) without abnormal findings: Secondary | ICD-10-CM | POA: Diagnosis not present

## 2016-10-02 DIAGNOSIS — Z6824 Body mass index (BMI) 24.0-24.9, adult: Secondary | ICD-10-CM | POA: Diagnosis not present

## 2016-10-16 ENCOUNTER — Other Ambulatory Visit: Payer: Self-pay | Admitting: Medical

## 2016-10-16 ENCOUNTER — Telehealth: Payer: Self-pay | Admitting: Medical

## 2016-10-16 MED ORDER — SYNTHROID 75 MCG PO TABS: ORAL_TABLET | ORAL | 0 refills | Status: DC

## 2016-10-16 NOTE — Telephone Encounter (Signed)
I sent month supply.  Get her in for 3-4 wk visit and labs, or if due at that time it can be CPX visit

## 2016-10-16 NOTE — Telephone Encounter (Signed)
Pt requesting refill on Synthroid 75 mcg. Pt thinks that maybe Audelia Acton has not refilled this med before but would like for him to maintain this med. Pt only has one pill left.

## 2016-10-21 NOTE — Telephone Encounter (Signed)
Pt is not interested in scheduling a CPE She just had a routine exam with her OBGYN about 1 1/2 wks ago and been seeing the OB regularly as well as a surgeon due to a cyst on her ovary so pt is going to forego a CPE with [place]. She will be making an appoint soon to f/u with Audelia Acton.

## 2016-11-09 ENCOUNTER — Other Ambulatory Visit: Payer: Self-pay | Admitting: Medical

## 2016-12-09 ENCOUNTER — Encounter: Payer: Self-pay | Admitting: Medical

## 2016-12-09 ENCOUNTER — Ambulatory Visit (INDEPENDENT_AMBULATORY_CARE_PROVIDER_SITE_OTHER): Payer: Commercial Managed Care - PPO | Admitting: Medical

## 2016-12-09 VITALS — BP 102/60 | HR 64 | Wt 143.8 lb

## 2016-12-09 DIAGNOSIS — G4700 Insomnia, unspecified: Secondary | ICD-10-CM

## 2016-12-09 DIAGNOSIS — E039 Hypothyroidism, unspecified: Secondary | ICD-10-CM | POA: Diagnosis not present

## 2016-12-09 DIAGNOSIS — R739 Hyperglycemia, unspecified: Secondary | ICD-10-CM

## 2016-12-09 DIAGNOSIS — F418 Other specified anxiety disorders: Secondary | ICD-10-CM | POA: Diagnosis not present

## 2016-12-09 DIAGNOSIS — R635 Abnormal weight gain: Secondary | ICD-10-CM | POA: Diagnosis not present

## 2016-12-09 DIAGNOSIS — R232 Flushing: Secondary | ICD-10-CM | POA: Diagnosis not present

## 2016-12-09 NOTE — Progress Notes (Signed)
Subjective: Chief Complaint  Patient presents with  . med check    med check, weight gain  and wants to have her thyroids, also discuss getting valium for plane ride    Here for medication review and concerns.   Hypothyroidism - been compliant with Synthroid 58mcg daily. However been dealing with weight gain.  Recently had surgery to remove left ovary.   However, gained weight since recently surgery, can't seem to lose the weight.   Can't seem to get back to her goal weight  135lb goal weight June 2017, through weight watchers.  Was last 135lb in late November right before the surgery.  Leading up to the surgery periods were regular.   Was told that after surgery the other ovary would compensate, but instead seems to be moving towards menopause.  Exercising - some, mostly cardio..  Eating healthy has been cutting back calories even more.   worried that clothes are fitting tight is frustrated.  Having to use sleep aid OTC given hot flashes, insomnia.   Here for travel concerns.   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  The turbulence really scares here.  Wants to have supply of medication for these flights.   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  This past year she has done well on the combination of a valium taken before flights, and then Xanax if the ride gets bumpy or heavy turbulence.   Usually valium alone works great and doesn't completely sedate her.  She does well on this combo  Past Medical History:  Diagnosis Date  . Allergic rhinitis    dust  . Anxiety    fear of flight, public speaking  . Dysthymia   . Gastritis   . GERD (gastroesophageal reflux disease)   . H/O mammogram 2/14  . Hypothyroidism   . Routine gynecological examination    Dr. Benjie Karvonen, pap 1/14  . Vitamin D deficiency     Current Outpatient Prescriptions on File Prior to Visit  Medication Sig  Dispense Refill  . fexofenadine (ALLEGRA) 30 MG tablet Take 30 mg by mouth 2 (two) times daily. Reported on 01/30/2016    . SYNTHROID 75 MCG tablet TAKE 1 TABLET BY MOUTH EVERY DAY 30 tablet 0  . ALPRAZolam (XANAX) 0.5 MG tablet Take 1 tablet (0.5 mg total) by mouth at bedtime as needed for anxiety. (Patient not taking: Reported on 12/09/2016) 30 tablet 1  . diazepam (VALIUM) 10 MG tablet Take 1/2 -1 tablet PRN for flight anxiety (Patient not taking: Reported on 12/09/2016) 20 tablet 1  . triamcinolone (NASACORT AQ) 55 MCG/ACT nasal inhaler Place 2 sprays into the nose daily. Reported on 01/30/2016     No current facility-administered medications on file prior to visit.    ROS as in subjective    Objective: BP 102/60   Pulse 64   Wt 143 lb 12.8 oz (65.2 kg)   SpO2 97%   BMI 24.68 kg/m   Wt Readings from Last 3 Encounters:  12/09/16 143 lb 12.8 oz (65.2 kg)  08/26/16 139 lb 4.8 oz (63.2 kg)  04/01/16 137 lb (62.1 kg)   General appearance: alert, no distress, WD/WN,  Neck: supple, no lymphadenopathy, no thyromegaly, no masses Heart: RRR, normal S1, S2, no murmurs Lungs: CTA bilaterally, no wheezes, rhonchi, or rales Abdomen: +bs, soft, non tender, non distended, no masses, no hepatomegaly,  no splenomegaly Pulses: 2+ symmetric, upper and lower extremities, normal cap refill Ext: no edema Psych: pleasant, but seems more frustrated today    Assessment: Encounter Diagnoses  Name Primary?  . Hypothyroidism, unspecified type Yes  . Weight gain   . Situational anxiety   . Hot flashes   . Insomnia, unspecified type   . Hyperglycemia     Plan: Discussed her concerns, symptoms that may suggest menopausal.   Labs today.  Discussed importance of keeping Synthroid name brand and consistent.   discussed exercise, diet, gave recommendations on jump starting weight loss.    Discussed ways to deal with menopausal symptoms.  She may need to have f/u with gyn regarding HRT.    Situational  anxiety - She can call back when its time for her trip for refill on Valium/and or xanax.  Ngozi was seen today for med check.  Diagnoses and all orders for this visit:  Hypothyroidism, unspecified type -     TSH -     T4, Free -     T3 -     T3, free -     Hemoglobin A1c -     FSH/LH  Weight gain -     TSH -     Hemoglobin A1c  Situational anxiety  Hot flashes -     FSH/LH  Insomnia, unspecified type -     FSH/LH  Hyperglycemia -     Hemoglobin A1c

## 2016-12-10 ENCOUNTER — Other Ambulatory Visit: Payer: Self-pay | Admitting: Medical

## 2016-12-10 LAB — T4, FREE: Free T4: 1.5 ng/dL (ref 0.8–1.8)

## 2016-12-10 LAB — T3, FREE: T3 FREE: 3 pg/mL (ref 2.3–4.2)

## 2016-12-10 LAB — TSH: TSH: 1.15 mIU/L

## 2016-12-10 LAB — FSH/LH
FSH: 163.6 m[IU]/mL — AB
LH: 55 m[IU]/mL

## 2016-12-10 LAB — HEMOGLOBIN A1C
Hgb A1c MFr Bld: 4.9 % (ref <5.7)
Mean Plasma Glucose: 94 mg/dL

## 2016-12-10 LAB — T3: T3, Total: 78 ng/dL (ref 76–181)

## 2016-12-10 MED ORDER — SYNTHROID 75 MCG PO TABS: 75.0000 ug | ORAL_TABLET | Freq: Every day | ORAL | 3 refills | Status: DC

## 2016-12-25 LAB — LIPID PANEL: LDL Cholesterol: 130

## 2016-12-25 LAB — HEMOGLOBIN A1C: Hemoglobin A1C: 5.1

## 2017-01-01 ENCOUNTER — Telehealth: Payer: Self-pay

## 2017-01-01 NOTE — Telephone Encounter (Signed)
Pt reports that she needs scripts for Valium and Xanax as she is going out of town with flight to and from [place]. Please advise.  CB [phone]

## 2017-01-02 ENCOUNTER — Other Ambulatory Visit: Payer: Self-pay

## 2017-01-02 MED ORDER — ALPRAZOLAM 0.5 MG PO TABS: 0.5000 mg | ORAL_TABLET | Freq: Every evening | ORAL | 0 refills | Status: DC | PRN

## 2017-01-02 MED ORDER — DIAZEPAM 10 MG PO TABS: ORAL_TABLET | ORAL | 0 refills | Status: DC

## 2017-01-02 NOTE — Telephone Encounter (Signed)
Called in per jcl 

## 2017-01-02 NOTE — Telephone Encounter (Signed)
She was told by Audelia Acton he would refill these for her on trips. She goes out of town Saturday. She just saw Audelia Acton 2 weeks ago, and Audelia Acton was going to write these for her, but pt declined at that time. Victorino December

## 2017-01-02 NOTE — Telephone Encounter (Signed)
Okay to call in the prescriptions in the record but no refill.

## 2017-01-02 NOTE — Telephone Encounter (Signed)
Both xanax and valium called in with no refills per [facility]

## 2017-01-27 ENCOUNTER — Ambulatory Visit: Payer: Commercial Managed Care - PPO | Admitting: Medical

## 2017-01-27 ENCOUNTER — Encounter: Payer: Self-pay | Admitting: Medical

## 2017-01-27 ENCOUNTER — Ambulatory Visit (INDEPENDENT_AMBULATORY_CARE_PROVIDER_SITE_OTHER): Payer: Commercial Managed Care - PPO | Admitting: Medical

## 2017-01-27 DIAGNOSIS — R42 Dizziness and giddiness: Secondary | ICD-10-CM

## 2017-01-27 DIAGNOSIS — H538 Other visual disturbances: Secondary | ICD-10-CM | POA: Diagnosis not present

## 2017-01-27 LAB — CBC WITH DIFFERENTIAL/PLATELET
BASOS ABS: 59 {cells}/uL (ref 0–200)
Basophils Relative: 1 %
EOS ABS: 59 {cells}/uL (ref 15–500)
Eosinophils Relative: 1 %
HCT: 42.4 % (ref 35.0–45.0)
Hemoglobin: 13.7 g/dL (ref 11.7–15.5)
LYMPHS PCT: 34 %
Lymphs Abs: 2006 cells/uL (ref 850–3900)
MCH: 31.2 pg (ref 27.0–33.0)
MCHC: 32.3 g/dL (ref 32.0–36.0)
MCV: 96.6 fL (ref 80.0–100.0)
MONOS PCT: 12 %
MPV: 11 fL (ref 7.5–12.5)
Monocytes Absolute: 708 cells/uL (ref 200–950)
NEUTROS PCT: 52 %
Neutro Abs: 3068 cells/uL (ref 1500–7800)
PLATELETS: 301 10*3/uL (ref 140–400)
RBC: 4.39 MIL/uL (ref 3.80–5.10)
RDW: 13.7 % (ref 11.0–15.0)
WBC: 5.9 10*3/uL (ref 4.0–10.5)

## 2017-01-27 LAB — BASIC METABOLIC PANEL
BUN: 15 mg/dL (ref 7–25)
CHLORIDE: 106 mmol/L (ref 98–110)
CO2: 24 mmol/L (ref 20–31)
CREATININE: 0.83 mg/dL (ref 0.50–1.05)
Calcium: 9.6 mg/dL (ref 8.6–10.4)
GLUCOSE: 87 mg/dL (ref 65–99)
Potassium: 4.5 mmol/L (ref 3.5–5.3)
Sodium: 140 mmol/L (ref 135–146)

## 2017-01-27 MED ORDER — MECLIZINE HCL 25 MG PO TABS
25.0000 mg | ORAL_TABLET | Freq: Two times a day (BID) | ORAL | 0 refills | Status: DC
Start: 2017-01-27 — End: 2017-03-06

## 2017-01-27 NOTE — Progress Notes (Signed)
Subjective: Chief Complaint  Patient presents with  . Dizziness    dizziness, blurred vision  , felts like the room was spinning x 6 days    Here for c/o dizziness and blurred vision.  Just got back from a cruise.  Started about 6 days ago dramatically with dizziness.  [place] feeling these symptoms.   Had exercised that morning as usual feeling fine before being on the bus.   Symptoms began with room spinning sensation, nausea.   When she got off the bus at the stop, couldn't get her balance.   Out in the fresh air got a little better.  These symptom continued intermittent since.   She notes imbalance, dizziness.   Feels this when moving.  When in bed is fine.   Walking or anything on her feet , she gets the symptoms.    Driving straight on the road is ok, but if turning head gets the symptoms.   Feels best in the morning or not moving in the bed.  Was on the bus for 45 minutes before she felt the dizziness.   No tinnitus.  No numbness, tingling, weakness.  vision is blurry.  No vision loss.  Started seeing some vision changes at the airport in [place] this past Friday few days ago.   No incontinence.   No other aggravating or relieving factors. No other complaint.  Past Medical History:  Diagnosis Date  . Allergic rhinitis    dust  . Anxiety    fear of flight, public speaking  . Dysthymia   . Gastritis   . GERD (gastroesophageal reflux disease)   . H/O mammogram 2/14  . Hypothyroidism   . Routine gynecological examination    Dr. Benjie Karvonen, pap 1/14  . Vitamin D deficiency    Current Outpatient Prescriptions on File Prior to Visit  Medication Sig Dispense Refill  . ALPRAZolam (XANAX) 0.5 MG tablet Take 1 tablet (0.5 mg total) by mouth at bedtime as needed for anxiety. 30 tablet 0  . diazepam (VALIUM) 10 MG tablet Take 1/2 -1 tablet PRN for flight anxiety 20 tablet 0  . fexofenadine (ALLEGRA) 30 MG tablet Take 30 mg by mouth 2 (two) times  daily. Reported on 01/30/2016    . SYNTHROID 75 MCG tablet Take 1 tablet (75 mcg total) by mouth daily. 90 tablet 3  . triamcinolone (NASACORT AQ) 55 MCG/ACT nasal inhaler Place 2 sprays into the nose daily. Reported on 01/30/2016     No current facility-administered medications on file prior to visit.    ROS as in subjective   Objective: SpO2 99%   See extended vitals and orthostatics.   Visual acuity Left 20/50, right eye 20/70, visual fields full, WNL  General appearance: alert, no distress, WD/WN,  HEENT: normocephalic, sclerae anicteric, PERRLA, EOMi, nares patent, no discharge or erythema, pharynx normal Oral cavity: MMM, no lesions Neck: supple, no lymphadenopathy, no thyromegaly, no masses, no bruits Heart: RRR, normal S1, S2, no murmurs Lungs: CTA bilaterally, no wheezes, rhonchi, or rales Extremities: no edema, no cyanosis, no clubbing Pulses: 2+ symmetric, upper and lower extremities, normal cap refill Neurological: alert, oriented x 3, CN2-12 intact, strength normal upper extremities and lower extremities, sensation normal throughout, DTRs 2+ throughout, no cerebellar signs, gait normal Psychiatric: normal affect, behavior normal, pleasant     Assessment: Encounter Diagnoses  Name Primary?  . Dizziness Yes  . Blurred vision  Plan: Discussed her concerns, symptoms.   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   Will check labs.  Begin meclizine for the next several days to  Help with BPPV symptoms.   Hydrate well, avoid sudden motions as we discussed  She was adamant about referral to [facility] center.   We will make the referral, but she declined head imaging or referral to local neuro vestibular rehab or local neurologist.   If any new or worse symptoms in the next few days, then recheck immediately  Colleen Berry was seen today for dizziness.  Diagnoses and all orders for this visit:  Dizziness -     Basic metabolic panel -      CBC with Differential/Platelet  Blurred vision -     Basic metabolic panel -     CBC with Differential/Platelet  Other orders -     meclizine (ANTIVERT) 25 MG tablet; Take 1 tablet (25 mg total) by mouth 2 (two) times daily.

## 2017-01-30 ENCOUNTER — Telehealth: Payer: Self-pay | Admitting: Medical

## 2017-01-30 NOTE — Telephone Encounter (Signed)
Pt sent via email lab work put in your folder  She had labs done at work and wanted them put in her chart

## 2017-01-31 ENCOUNTER — Encounter: Payer: Self-pay | Admitting: Medical

## 2017-02-05 ENCOUNTER — Telehealth: Payer: Self-pay | Admitting: Medical

## 2017-02-05 NOTE — Telephone Encounter (Signed)
Has she went to the [facility] balance clinic?  We can go ahead and refer here locally to vestibular rehab at [facility].

## 2017-02-05 NOTE — Telephone Encounter (Signed)
Pt called back and states that she would like there referral for her BPPV, states she did decline the referral at first but her symptoms are getting worse, states you told her there was somewhere in [place] that you could refer her to, she can be reached at [phone]

## 2017-02-06 ENCOUNTER — Telehealth: Payer: Self-pay | Admitting: Medical

## 2017-02-06 NOTE — Telephone Encounter (Signed)
She also stated that she can go somewhere in [place] if there is somewhere out there that you recommend

## 2017-02-06 NOTE — Telephone Encounter (Signed)
She does have a appt July the 20th,with [facility] but has not ever been there, she would like to get in somewhere locally before July the 20th, and if they can help here she may cancel her appt with [facility], she is going to keep her appt with them as of right now.

## 2017-02-07 NOTE — Telephone Encounter (Signed)
Error

## 2017-02-10 ENCOUNTER — Other Ambulatory Visit: Payer: Self-pay

## 2017-02-10 DIAGNOSIS — R42 Dizziness and giddiness: Secondary | ICD-10-CM

## 2017-02-10 NOTE — Telephone Encounter (Signed)
I don't know any one in [place] specifically, so refer to vestibular rehab for vertigo either here or in [place], physical therapy or neuro rehab that does PT for vertigo.

## 2017-02-10 NOTE — Telephone Encounter (Signed)
Sent referral 

## 2017-02-10 NOTE — Telephone Encounter (Signed)
Called and spoke with pt she said that she didn't want got p/t  That she  Wants to be see first. I told her that we can refer to [facility] , or she can wait for her appt on the 03/07/2017 at [place] where she wanted to be referred too. Pt said that she will go with at with referral to [facility].

## 2017-03-06 ENCOUNTER — Ambulatory Visit (INDEPENDENT_AMBULATORY_CARE_PROVIDER_SITE_OTHER): Payer: Commercial Managed Care - PPO | Admitting: Medical

## 2017-03-06 VITALS — BP 110/70 | HR 89 | Wt 146.0 lb

## 2017-03-06 DIAGNOSIS — N951 Menopausal and female climacteric states: Secondary | ICD-10-CM | POA: Insufficient documentation

## 2017-03-06 DIAGNOSIS — R232 Flushing: Secondary | ICD-10-CM | POA: Diagnosis not present

## 2017-03-06 DIAGNOSIS — G479 Sleep disorder, unspecified: Secondary | ICD-10-CM | POA: Diagnosis not present

## 2017-03-06 MED ORDER — ZOLPIDEM TARTRATE 5 MG PO TABS: 5.0000 mg | ORAL_TABLET | Freq: Every evening | ORAL | 0 refills | Status: DC | PRN

## 2017-03-06 NOTE — Progress Notes (Signed)
Subjective: Chief Complaint  Patient presents with  . sleeping    having trouble staying a slept , waking up in the middle of the night.   Here for sleep concerns.  Interested in a sleep aid.  Since her ovary was removed 60mo ago, she has been in menopausal seemingly instantly.   This has really disrupted sleep.  She can get to sleep, but then awakes wide awake at 1am on several days.  She notes in the past 6 months has used Nyquil from time to time, but this was hard to get off.  Has had hot flashes intermittent, can be throughout the day, and the sleep can sometimes be fine.   Up until this year has not had any problems with sleep.   This has all started in the past 69mo s/p oophorectomy.  Doesn't feel the sleep problems are related to stress or other issues, mainly related to hormones, perimenopausal symptoms.  She has had period here or there, and sleep is fine when she has a period.  Takes allegra prn, but it doesn't make her sleepy. No other aggravating or relieving factors. No other complaint.   Past Medical History:  Diagnosis Date  . Allergic rhinitis    dust  . Anxiety    fear of flight, public speaking  . Dysthymia   . Gastritis   . GERD (gastroesophageal reflux disease)   . H/O mammogram 2/14  . Hypothyroidism   . Routine gynecological examination    Dr. Benjie Karvonen, pap 1/14  . Vitamin D deficiency    Current Outpatient Prescriptions on File Prior to Visit  Medication Sig Dispense Refill  . fexofenadine (ALLEGRA) 30 MG tablet Take 30 mg by mouth 2 (two) times daily. Reported on 01/30/2016    . SYNTHROID 75 MCG tablet Take 1 tablet (75 mcg total) by mouth daily. 90 tablet 3  . triamcinolone (NASACORT AQ) 55 MCG/ACT nasal inhaler Place 2 sprays into the nose daily. Reported on 01/30/2016     No current facility-administered medications on file prior to visit.    ROS as in subjective   Objective: BP 110/70   Pulse 89   Wt 146 lb (66.2 kg)   SpO2 98%   BMI 25.06 kg/m   Gen;  wd, wn, nad Psych: pleasant, good eye contact, answers question appropriately Neuro: non focal, CN2-12 intact   Assessment: Encounter Diagnoses  Name Primary?  . Sleep disturbance Yes  . Perimenopausal   . Hot flashes      Plan: Discussed her concerns.  Discussed sleep hygiene, discussed f/u with gynecology, and if perimenopausal symptoms continue, may need to consider HRT or other similar option.  In the meantime, hydrate well, avoid spicy foods, avoid eating or drinking close to bedtime, reduce stress where possible, practice good sleep hygiene.  Begin trial of prn Ambien. Discussed risks/benefits.  F/u in a few weeks.    Colleen Berry was seen today for sleeping.  Diagnoses and all orders for this visit:  Sleep disturbance  Perimenopausal  Hot flashes  Other orders -     zolpidem (AMBIEN) 5 MG tablet; Take 1 tablet (5 mg total) by mouth at bedtime as needed for sleep.

## 2017-03-07 DIAGNOSIS — H903 Sensorineural hearing loss, bilateral: Secondary | ICD-10-CM | POA: Diagnosis not present

## 2017-03-07 DIAGNOSIS — R42 Dizziness and giddiness: Secondary | ICD-10-CM | POA: Diagnosis not present

## 2017-03-10 DIAGNOSIS — R42 Dizziness and giddiness: Secondary | ICD-10-CM | POA: Diagnosis not present

## 2017-06-03 ENCOUNTER — Ambulatory Visit (INDEPENDENT_AMBULATORY_CARE_PROVIDER_SITE_OTHER): Payer: Commercial Managed Care - PPO | Admitting: Medical

## 2017-06-03 VITALS — BP 110/68 | HR 66 | Temp 98.0°F | Wt 153.6 lb

## 2017-06-03 DIAGNOSIS — H9203 Otalgia, bilateral: Secondary | ICD-10-CM

## 2017-06-03 DIAGNOSIS — R52 Pain, unspecified: Secondary | ICD-10-CM | POA: Diagnosis not present

## 2017-06-03 DIAGNOSIS — J0110 Acute frontal sinusitis, unspecified: Secondary | ICD-10-CM | POA: Diagnosis not present

## 2017-06-03 DIAGNOSIS — R5383 Other fatigue: Secondary | ICD-10-CM

## 2017-06-03 MED ORDER — AZITHROMYCIN 250 MG PO TABS: ORAL_TABLET | ORAL | 0 refills | Status: DC

## 2017-06-03 NOTE — Progress Notes (Signed)
Subjective: Chief Complaint  Patient presents with  . congestion    bodyaches, chills, congestion, little bit of coughing, and little sore throat    Here for cold symptoms. She notes about 2 week hx/o body aches, fatigue, subjective fever, mild sore throat, ear discomfort, head pressure, some cough.   Has been using dayquil and Nyquil, Tylenol.   No sick contacts.   At this point, not improving.   No other aggravating or relieving factors. No other complaint.  Past Medical History:  Diagnosis Date  . Allergic rhinitis    dust  . Anxiety    fear of flight, public speaking  . Dysthymia   . Gastritis   . GERD (gastroesophageal reflux disease)   . H/O mammogram 2/14  . Hypothyroidism   . Routine gynecological examination    Dr. Benjie Karvonen, pap 1/14  . Vitamin D deficiency    Current Outpatient Prescriptions on File Prior to Visit  Medication Sig Dispense Refill  . fexofenadine (ALLEGRA) 30 MG tablet Take 30 mg by mouth 2 (two) times daily. Reported on 01/30/2016    . SYNTHROID 75 MCG tablet Take 1 tablet (75 mcg total) by mouth daily. 90 tablet 3  . triamcinolone (NASACORT AQ) 55 MCG/ACT nasal inhaler Place 2 sprays into the nose daily. Reported on 01/30/2016    . zolpidem (AMBIEN) 5 MG tablet Take 1 tablet (5 mg total) by mouth at bedtime as needed for sleep. 10 tablet 0   No current facility-administered medications on file prior to visit.    ROS as in subjective   Objective: BP 110/68   Pulse 66   Temp 98 F (36.7 C)   Wt 153 lb 9.6 oz (69.7 kg)   SpO2 96%   BMI 26.37 kg/m   General appearance: alert, no distress, WD/WN,  HEENT: normocephalic, sclerae anicteric, TMs flat, mild erythema, nares patent, no discharge or erythema, pharynx with mild erythema Oral cavity: MMM, no lesions Neck: supple, shoddy anterior nodes, no thyromegaly, no masses Heart: RRR, normal S1, S2, no murmurs Lungs: CTA bilaterally, no wheezes, rhonchi, or rales    Assessment: Encounter Diagnoses   Name Primary?  . Acute non-recurrent frontal sinusitis Yes  . Body aches   . Ear discomfort, bilateral   . Fatigue, unspecified type     Plan; Advised rest, hydration, begin zpak, can c/t dayquil/nyquil, and if not much improved within a week, the recheck.  Colleen Berry was seen today for congestion.  Diagnoses and all orders for this visit:  Acute non-recurrent frontal sinusitis  Body aches  Ear discomfort, bilateral  Fatigue, unspecified type  Other orders -     azithromycin (ZITHROMAX) 250 MG tablet; 2 tablets day 1, then 1 tablet days 2-4

## 2017-06-09 ENCOUNTER — Telehealth: Payer: Self-pay | Admitting: Medical

## 2017-06-09 ENCOUNTER — Ambulatory Visit: Payer: Commercial Managed Care - PPO | Admitting: Medical

## 2017-06-09 ENCOUNTER — Other Ambulatory Visit: Payer: Self-pay | Admitting: Medical

## 2017-06-09 MED ORDER — PREDNISONE 20 MG PO TABS: ORAL_TABLET | ORAL | 0 refills | Status: DC

## 2017-06-09 MED ORDER — LEVOFLOXACIN 500 MG PO TABS: 500.0000 mg | ORAL_TABLET | Freq: Every day | ORAL | 0 refills | Status: DC

## 2017-06-09 NOTE — Telephone Encounter (Signed)
I sent round of Levaquin antibiotic and steroid/prednisone.   Have her begin these.

## 2017-06-09 NOTE — Telephone Encounter (Signed)
Called pt informed

## 2017-06-09 NOTE — Telephone Encounter (Signed)
Pt states she was quite worse Saturday, little better today, finished antibiotic this morning.  Said you told her to call if she wasn't better, feels like she needs another round of antibiotics.  When she gets out of bed and tries to do anything gets worse.  Been sick 3 weeks.  Cough, muscle aches, nasal congestion, slight sore throat.   Will come in if you think she needs to but doesn't want to spend the extra money if she doesn't need to.  Please advise as soon as possible, if she needs to be seen

## 2017-06-16 ENCOUNTER — Ambulatory Visit (INDEPENDENT_AMBULATORY_CARE_PROVIDER_SITE_OTHER): Payer: Commercial Managed Care - PPO | Admitting: Medical

## 2017-06-16 VITALS — BP 114/70 | HR 80 | Temp 98.3°F | Wt 152.8 lb

## 2017-06-16 DIAGNOSIS — R5382 Chronic fatigue, unspecified: Secondary | ICD-10-CM | POA: Diagnosis not present

## 2017-06-16 DIAGNOSIS — M791 Myalgia, unspecified site: Secondary | ICD-10-CM | POA: Diagnosis not present

## 2017-06-16 NOTE — Progress Notes (Signed)
Subjective: Chief Complaint  Patient presents with  . still not feeling better    coughing, some throat, feeling tried , bodyaches    Here for recheck.  I saw her on 06/03/17 for sinus symptoms,she did round of zpak, then called back when things weren't improved.  She did round of Levaquin but not the steroid that was called out.  She ended up doing round of nasal steroid OTC.  Saw a lot of improvement on Levaquin.  She was scared to use round of oral prednisone.  Felt great this past weekend on Friday.  However did intense work over weekend all day, and this really drained her.  Worked all day that day.  Ended up doing some dishes and cleaned house that evening.   The next day took 2-3 hour nap.   Took long walk with her dog, felt super exhausted that afternoon, felt like she hit a "wall".   Rested the rest of the day.  She notes in her 99s she would have recurring problems where illness would wear her out.   She notes hx/o diagnosis of chronic fatigue syndrome. She wonders if the last 18 mo of stress and recent illness has just worn her out. She does report body aches, extreme fatigue.  Sleep is ok and sleep and rest really helps.  Has spent a lot more time in bed the last 4 weeks.   She does have  a new project at work she has to work on for the foreseeable future.  Still getting fairly regular periods  Does feel like she has a mild fever at times.    Has been feeling this way over a month now despite the sinus symptoms  She notes hx/o chronic fatigue syndrome diagnosed early 65s by [facility], and symptoms eventually resolved in late 55s. She wonders if this is a recurrence.   No other aggravating or relieving factors. No other complaint.   Past Medical History:  Diagnosis Date  . Allergic rhinitis    dust  . Anxiety    fear of flight, public speaking  . Dysthymia   . Gastritis   . GERD (gastroesophageal reflux disease)   . H/O mammogram 2/14  . Hypothyroidism   . Routine gynecological  examination    Dr. Benjie Karvonen, pap 1/14  . Vitamin D deficiency    Current Outpatient Prescriptions on File Prior to Visit  Medication Sig Dispense Refill  . fexofenadine (ALLEGRA) 30 MG tablet Take 30 mg by mouth 2 (two) times daily. Reported on 01/30/2016    . SYNTHROID 75 MCG tablet Take 1 tablet (75 mcg total) by mouth daily. 90 tablet 3  . triamcinolone (NASACORT AQ) 55 MCG/ACT nasal inhaler Place 2 sprays into the nose daily. Reported on 01/30/2016    . zolpidem (AMBIEN) 5 MG tablet Take 1 tablet (5 mg total) by mouth at bedtime as needed for sleep. 10 tablet 0  . predniSONE (DELTASONE) 20 MG tablet 3 tablets today, 2 tablets tomorrow, 1 tablet the third day (Patient not taking: Reported on 06/16/2017) 6 tablet 0   No current facility-administered medications on file prior to visit.    Past Surgical History:  Procedure Laterality Date  . KNEE SURGERY     multiple   ROS as in subjective    Objective: BP 114/70   Pulse 80   Temp 98.3 F (36.8 C)   Wt 152 lb 12.8 oz (69.3 kg)   SpO2 98%   BMI 26.23 kg/m  Wt Readings from Last 3 Encounters:  06/16/17 152 lb 12.8 oz (69.3 kg)  06/03/17 153 lb 9.6 oz (69.7 kg)  03/06/17 146 lb (66.2 kg)     General appearance: alert, no distress, WD/WN,  HEENT: normocephalic, sclerae anicteric, PERRLA, EOMi, nares patent, no discharge or erythema, pharynx normal Oral cavity: MMM, no lesions Neck: supple, no lymphadenopathy, no thyromegaly, no masses Heart: RRR, normal S1, S2, no murmurs Lungs: CTA bilaterally, no wheezes, rhonchi, or rales Back: non tender Musculoskeletal: non tender, no swelling, no obvious deformity Extremities: no edema, no cyanosis, no clubbing Pulses: 2+ symmetric, upper and lower extremities, normal cap refill Neurological: alert, oriented x 3, CN2-12 intact, strength normal upper extremities and lower extremities, sensation normal throughout, DTRs 2+ throughout, no cerebellar signs, gait normal Psychiatric: normal  affect, behavior normal, pleasant     Assessment: Encounter Diagnoses  Name Primary?  . Chronic fatigue Yes  . Myalgia     Plan Discussed symptoms, possible causes, reviewed recent visit notes where I saw her for sinusitis.   Labs today.  advised rest, hydration, and we will call with lab results.  She requested that she may need a letter recommending rest at home.  She wants to work from home the next 2 weeks to recover from fatigue and the recent sinusitis illness.  She works from hard part time already, but she feels the need to have more time at home to rest the next 2 weeks.  This is what she would use in the past when she was diagnosed with chronic fatigue in her 64s.  I advised that we would consider this request when lab results are in.    She was upset with me about not automatically agreeing to her request.   I advised that a lot of factors go into making those decisions, and I can't make that call at this time without reviewing labs first.  She has already talked to her employer about possibly working more than usual from home the next 2 weeks.  Lillionna was seen today for still not feeling better.  Diagnoses and all orders for this visit:  Chronic fatigue -     CBC with Differential/Platelet -     CK -     Sedimentation rate -     Epstein-Barr Virus VCA Antibody Panel -     B. Burgdorfi Antibodies -     CMV abs, IgG+IgM (cytomegalovirus)  Myalgia -     CBC with Differential/Platelet -     CK -     Sedimentation rate -     Epstein-Barr Virus VCA Antibody Panel -     B. Burgdorfi Antibodies -     CMV abs, IgG+IgM (cytomegalovirus)

## 2017-06-17 LAB — SEDIMENTATION RATE: Sed Rate: 6 mm/h (ref 0–30)

## 2017-06-17 LAB — EPSTEIN-BARR VIRUS VCA ANTIBODY PANEL
EBV NA IgG: 529 U/mL — ABNORMAL HIGH
EBV VCA IgG: 508 U/mL — ABNORMAL HIGH
EBV VCA IgM: <36 U/mL

## 2017-06-17 LAB — CBC WITH DIFFERENTIAL/PLATELET
BASOS ABS: 50 {cells}/uL (ref 0–200)
Basophils Relative: 0.7 %
EOS ABS: 22 {cells}/uL (ref 15–500)
EOS PCT: 0.3 %
HCT: 39.4 % (ref 35.0–45.0)
HEMOGLOBIN: 13.5 g/dL (ref 11.7–15.5)
LYMPHS ABS: 1699 {cells}/uL (ref 850–3900)
MCH: 31.4 pg (ref 27.0–33.0)
MCHC: 34.3 g/dL (ref 32.0–36.0)
MCV: 91.6 fL (ref 80.0–100.0)
MONOS PCT: 8.7 %
MPV: 11.3 fL (ref 7.5–12.5)
NEUTROS ABS: 4802 {cells}/uL (ref 1500–7800)
NEUTROS PCT: 66.7 %
PLATELETS: 304 10*3/uL (ref 140–400)
RBC: 4.3 10*6/uL (ref 3.80–5.10)
RDW: 12.5 % (ref 11.0–15.0)
Total Lymphocyte: 23.6 %
WBC mixed population: 626 cells/uL (ref 200–950)
WBC: 7.2 10*3/uL (ref 3.8–10.8)

## 2017-06-17 LAB — CK: Total CK: 61 U/L (ref 29–143)

## 2017-06-17 LAB — CMV ABS, IGG+IGM (CYTOMEGALOVIRUS): CMV IgM: <30 AU/mL

## 2017-06-17 LAB — B. BURGDORFI ANTIBODIES

## 2017-07-03 ENCOUNTER — Telehealth: Payer: Self-pay | Admitting: Medical

## 2017-07-03 NOTE — Telephone Encounter (Signed)
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 She states that when she flies you usually give her a valium. She is leaving next week. Please send to [facility] on [address]. Pt can be reached at [phone]. She also states that she is feeling much better from her office visit least week.

## 2017-07-04 ENCOUNTER — Other Ambulatory Visit: Payer: Self-pay | Admitting: Medical

## 2017-07-04 MED ORDER — DIAZEPAM 10 MG PO TABS: ORAL_TABLET | ORAL | 0 refills | Status: DC

## 2017-07-04 NOTE — Telephone Encounter (Signed)
Called into the pharmacy.

## 2017-07-04 NOTE — Telephone Encounter (Signed)
Call out #[phone], no refill

## 2017-07-19 HISTORY — PX: OOPHORECTOMY: SHX86

## 2017-10-15 ENCOUNTER — Encounter: Payer: Self-pay | Admitting: Medical

## 2017-10-15 ENCOUNTER — Ambulatory Visit (INDEPENDENT_AMBULATORY_CARE_PROVIDER_SITE_OTHER): Payer: Commercial Managed Care - PPO | Admitting: Medical

## 2017-10-15 VITALS — BP 110/66 | HR 84 | Wt 154.0 lb

## 2017-10-15 DIAGNOSIS — G479 Sleep disorder, unspecified: Secondary | ICD-10-CM

## 2017-10-15 DIAGNOSIS — E039 Hypothyroidism, unspecified: Secondary | ICD-10-CM

## 2017-10-15 DIAGNOSIS — R232 Flushing: Secondary | ICD-10-CM

## 2017-10-15 DIAGNOSIS — N951 Menopausal and female climacteric states: Secondary | ICD-10-CM | POA: Diagnosis not present

## 2017-10-15 DIAGNOSIS — G4700 Insomnia, unspecified: Secondary | ICD-10-CM | POA: Diagnosis not present

## 2017-10-15 MED ORDER — DIAZEPAM 10 MG PO TABS: ORAL_TABLET | ORAL | 0 refills | Status: DC

## 2017-10-15 NOTE — Progress Notes (Signed)
Subjective: Chief Complaint  Patient presents with  . having trouble sleeping    having trouble sleeping after she has her peroids, discuss going on meds to help   Here for sleep issues.  Still getting her periods every month, but the week after her period has a bad week in regards to sleep.   Has trouble getting to sleep.  But wakes up inevitable at 1am.  Gets flushed feeling, hot flashes.  When not getting good sleep, gets nausea, stomach upset.  Been using Ambien but this didn't help at all.  Right after she had ovary removed, had bad problems with hot flashes,  Cramps, irritability.  After about 6 months things settled down.  However, in recent months has 4-5 days the week after her period where sleep is horrible.   Wants to try Diazepam.   She has used this for flight anxiety and it worked well.  Just wants to use the week after her period.     Last visit when we discussed her viral illness, she ended up getting better after extended period of rest.  She had a recurrence or new viral illness last week but is doing better.   No current URI or GI symptoms.     Past Medical History:  Diagnosis Date  . Allergic rhinitis    dust  . Anxiety    fear of flight, public speaking  . Dysthymia   . Gastritis   . GERD (gastroesophageal reflux disease)   . H/O mammogram 2/14  . Hypothyroidism   . Routine gynecological examination    Dr. Benjie Karvonen, pap 1/14  . Vitamin D deficiency    Current Outpatient Medications on File Prior to Visit  Medication Sig Dispense Refill  . fexofenadine (ALLEGRA) 30 MG tablet Take 30 mg by mouth 2 (two) times daily. Reported on 01/30/2016    . SYNTHROID 75 MCG tablet Take 1 tablet (75 mcg total) by mouth daily. 90 tablet 3  . triamcinolone (NASACORT AQ) 55 MCG/ACT nasal inhaler Place 2 sprays into the nose daily. Reported on 01/30/2016     No current facility-administered medications on file prior to visit.    ROS as in subjective   Objective: BP 110/66   Pulse 84    Wt 154 lb (69.9 kg)   SpO2 99%   BMI 26.43 kg/m   General appearance: alert, no distress, WD/WN,  HEENT: normocephalic, sclerae anicteric, TMs pearly, nares patent, no discharge or erythema, pharynx normal Oral cavity: MMM, no lesions Neck: supple, no lymphadenopathy, no thyromegaly, no masses Heart: RRR, normal S1, S2, no murmurs Lungs: CTA bilaterally, no wheezes, rhonchi, or rales Pulses: 2+ symmetric, upper and lower extremities, normal cap refill     Assessment: Encounter Diagnoses  Name Primary?  . Insomnia, unspecified type Yes  . Hypothyroidism, unspecified type   . Hot flashes   . Perimenopausal   . Sleep disturbance       Plan: Discussed symptoms, concerns.   Discuss that her sleep issues mostly related to hormonal state, but other factors can play a role.  Discussed sleep hygiene, role of exercise, good hydration.  She is not exercising like she should and sleep hygiene could be improved as discussed.  After discussing options for therapy, including Ambien, belsomra, other sleep aids OTC and prescription, we will use 1/2-1 tablet diazepam 3-4 nights per month prn.   Discussed risks/benefits of medication.   also discussed benefits of HRT or SSRI for menopausal symptoms but her symptoms are mainly  limited to 1 week a month, not weekly symptoms.  F/u with call or recheck in a few weeks to update on [place] how this is going.  Colleen Berry was seen today for having trouble sleeping.  Diagnoses and all orders for this visit:  Insomnia, unspecified type  Hypothyroidism, unspecified type  Hot flashes  Perimenopausal  Sleep disturbance  Other orders -     diazepam (VALIUM) 10 MG tablet; Take 1/2 -1 tablet PRN for flight anxiety

## 2017-12-15 ENCOUNTER — Telehealth: Payer: Self-pay | Admitting: Medical

## 2017-12-15 ENCOUNTER — Other Ambulatory Visit: Payer: Self-pay | Admitting: Medical

## 2017-12-15 MED ORDER — DIAZEPAM 10 MG PO TABS: ORAL_TABLET | ORAL | 0 refills | Status: DC

## 2017-12-15 MED ORDER — ALPRAZOLAM 0.5 MG PO TABS
0.5000 mg | ORAL_TABLET | Freq: Every day | ORAL | 0 refills | Status: DC
Start: 2017-12-15 — End: 2018-02-24

## 2017-12-15 NOTE — Telephone Encounter (Signed)
Pt called and is requesting a RX of valium and xanax states she gets real bad anxiety when she flys, states she is leaving to go to [place] on Thursday, pt uses [facility] [place] [address] pt can be reached at [phone], she would like a call to let her know to if they medicines was sent in

## 2017-12-15 NOTE — Telephone Encounter (Signed)
Med sent.

## 2017-12-15 NOTE — Telephone Encounter (Signed)
Called and informed pt that rx was sent to the pharmacy °

## 2018-01-01 ENCOUNTER — Other Ambulatory Visit: Payer: Self-pay | Admitting: Medical

## 2018-01-29 DIAGNOSIS — M545 Low back pain: Secondary | ICD-10-CM | POA: Diagnosis not present

## 2018-01-29 DIAGNOSIS — M5441 Lumbago with sciatica, right side: Secondary | ICD-10-CM | POA: Diagnosis not present

## 2018-01-29 DIAGNOSIS — M79661 Pain in right lower leg: Secondary | ICD-10-CM | POA: Diagnosis not present

## 2018-02-10 DIAGNOSIS — M5416 Radiculopathy, lumbar region: Secondary | ICD-10-CM | POA: Diagnosis not present

## 2018-02-13 DIAGNOSIS — Z6826 Body mass index (BMI) 26.0-26.9, adult: Secondary | ICD-10-CM | POA: Diagnosis not present

## 2018-02-13 DIAGNOSIS — Z01419 Encounter for gynecological examination (general) (routine) without abnormal findings: Secondary | ICD-10-CM | POA: Diagnosis not present

## 2018-02-13 DIAGNOSIS — Z1231 Encounter for screening mammogram for malignant neoplasm of breast: Secondary | ICD-10-CM | POA: Diagnosis not present

## 2018-02-13 DIAGNOSIS — R232 Flushing: Secondary | ICD-10-CM | POA: Diagnosis not present

## 2018-02-13 LAB — HM PAP SMEAR: HM Pap smear: NORMAL

## 2018-02-14 LAB — HM MAMMOGRAPHY

## 2018-02-16 DIAGNOSIS — M5416 Radiculopathy, lumbar region: Secondary | ICD-10-CM | POA: Diagnosis not present

## 2018-02-18 DIAGNOSIS — M5416 Radiculopathy, lumbar region: Secondary | ICD-10-CM | POA: Diagnosis not present

## 2018-02-24 ENCOUNTER — Other Ambulatory Visit: Payer: Self-pay | Admitting: Medical

## 2018-02-24 ENCOUNTER — Telehealth: Payer: Self-pay | Admitting: Medical

## 2018-02-24 MED ORDER — DIAZEPAM 10 MG PO TABS: ORAL_TABLET | ORAL | 0 refills | Status: DC

## 2018-02-24 MED ORDER — ALPRAZOLAM 0.5 MG PO TABS
0.5000 mg | ORAL_TABLET | Freq: Every day | ORAL | 0 refills | Status: DC
Start: 2018-02-24 — End: 2018-08-31

## 2018-02-24 NOTE — Telephone Encounter (Signed)
Pt stopped by and stated that she is going out of the country. She states she usually is given something for her anxiety. Pt is requested a fex of EACH xanax and valium. [facility] [address]. Pt can be reached at [phone].

## 2018-03-19 DIAGNOSIS — M5416 Radiculopathy, lumbar region: Secondary | ICD-10-CM | POA: Diagnosis not present

## 2018-03-23 DIAGNOSIS — M5416 Radiculopathy, lumbar region: Secondary | ICD-10-CM | POA: Diagnosis not present

## 2018-03-26 DIAGNOSIS — M5416 Radiculopathy, lumbar region: Secondary | ICD-10-CM | POA: Diagnosis not present

## 2018-05-19 ENCOUNTER — Telehealth: Payer: Self-pay

## 2018-05-19 NOTE — Telephone Encounter (Signed)
Incoming call from patient, she reports she was seen here in January 2018 and reports she has had weight gain and bloating.  She has an appt for u/s with Dr Benjie Karvonen but reports she wasn't sure if she should be seen back here instead.  I let her know, per Joylene John NP, pt can see her primary ob/gyn and have u/s as scheduled and if [facility] office has any concerns, they can call our office for referral back here. Pt voiced understanding and said she will start there at their office for u/s and will call back to our office if needed. No other needs per pt at this time.

## 2018-05-27 DIAGNOSIS — Z79890 Hormone replacement therapy (postmenopausal): Secondary | ICD-10-CM | POA: Diagnosis not present

## 2018-05-27 DIAGNOSIS — R140 Abdominal distension (gaseous): Secondary | ICD-10-CM | POA: Diagnosis not present

## 2018-06-09 ENCOUNTER — Other Ambulatory Visit: Payer: Self-pay | Admitting: Medical

## 2018-06-10 NOTE — Telephone Encounter (Signed)
Have her make appt for f/u on thyroid.  Last lab over 1 year ago, can send refill after appt made

## 2018-06-10 NOTE — Telephone Encounter (Signed)
Is this ok to refill?  

## 2018-06-10 NOTE — Telephone Encounter (Signed)
Patient made appointment for 06-24-18.

## 2018-06-24 ENCOUNTER — Ambulatory Visit (INDEPENDENT_AMBULATORY_CARE_PROVIDER_SITE_OTHER): Payer: Commercial Managed Care - PPO | Admitting: Medical

## 2018-06-24 ENCOUNTER — Encounter: Payer: Self-pay | Admitting: Medical

## 2018-06-24 VITALS — BP 120/70 | HR 76 | Temp 97.9°F | Resp 16 | Ht 64.0 in | Wt 163.0 lb

## 2018-06-24 DIAGNOSIS — E039 Hypothyroidism, unspecified: Secondary | ICD-10-CM | POA: Diagnosis not present

## 2018-06-24 DIAGNOSIS — Z7189 Other specified counseling: Secondary | ICD-10-CM | POA: Diagnosis not present

## 2018-06-24 DIAGNOSIS — Z7185 Encounter for immunization safety counseling: Secondary | ICD-10-CM

## 2018-06-24 NOTE — Progress Notes (Addendum)
Subjective: Chief Complaint  Patient presents with  . med check    med check    Here for med check.  She has a high deductible plan and wants to avoid a bunch of charges today.   She has a history of hypothyroidism, vitamin D deficiency.  She is not currently taking vitamin D.  She usually is good about taking her thyroid medicine but in the last week she forgot to take a couple doses.   She is currently taking Synthroid 75 mcg daily in the morning.  She feels like she should only have to come in once a year for thyroid blood test, although her last visit for that specifically was April 2018, over a year and a half ago.  She notes that she has been on the same dose for several years.  She denies any thyroid symptoms currently does not feel like there is anything going on that she should be concerned about.  She does note recently contracting the flu.  It took a while to get over this and she does like she is more at risk for catching flu viruses.    She has no other concerns today.   Past Medical History:  Diagnosis Date  . Allergic rhinitis    dust  . Anxiety    fear of flight, public speaking  . Dysthymia   . Gastritis   . GERD (gastroesophageal reflux disease)   . H/O mammogram 2/14  . Hypothyroidism   . Routine gynecological examination    Dr. Benjie Karvonen, pap 1/14  . Vitamin D deficiency    Current Outpatient Medications on File Prior to Visit  Medication Sig Dispense Refill  . fexofenadine (ALLEGRA) 30 MG tablet Take 30 mg by mouth 2 (two) times daily. Reported on 01/30/2016    . SYNTHROID 75 MCG tablet TAKE 1 TABLET(75 MCG) BY MOUTH DAILY 90 tablet 0  . triamcinolone (NASACORT AQ) 55 MCG/ACT nasal inhaler Place 2 sprays into the nose daily. Reported on 01/30/2016    . ALPRAZolam (XANAX) 0.5 MG tablet Take 1 tablet (0.5 mg total) by mouth at bedtime. prn (Patient not taking: Reported on 06/24/2018) 15 tablet 0  . diazepam (VALIUM) 10 MG tablet Take 1/2 -1 tablet PRN for flight anxiety  (Patient not taking: Reported on 06/24/2018) 15 tablet 0   No current facility-administered medications on file prior to visit.    ROS as in subjective   Objective: BP 120/70   Pulse 76   Temp 97.9 F (36.6 C) (Oral)   Resp 16   Ht 5\' 4"  (1.626 m)   Wt 163 lb (73.9 kg)   SpO2 97%   BMI 27.98 kg/m   Wt Readings from Last 3 Encounters:  06/24/18 163 lb (73.9 kg)  10/15/17 154 lb (69.9 kg)  06/16/17 152 lb 12.8 oz (69.3 kg)     General appearance: alert, no distress, WD/WN,  Neck: supple, no lymphadenopathy, no thyromegaly, no masses Heart: RRR, normal S1, S2, no murmurs Lungs: CTA bilaterally, no wheezes, rhonchi, or rales Pulses: 2+ symmetric, upper and lower extremities, normal cap refill     Assessment: Encounter Diagnoses  Name Primary?  . Hypothyroidism, unspecified type Yes  . Vaccine counseling      Plan: Hypothyroidism- given no insurance and financial limitations she also wants to check thyroid today.  Advised that she has to come in at least once yearly to check the thyroid labs and recheck on medication for standard of care.  We discussed compliance,  proper use of medication.    advised flu shot yearly.  She is planning to go to [facility] as they are offering free flu vaccines there.    We will request recent Pap and mammogram records from gynecology as she is up-to-date.  Advised yearly physical here  Verginia was seen today for med check.  Diagnoses and all orders for this visit:  Hypothyroidism, unspecified type -     TSH  Vaccine counseling

## 2018-06-25 ENCOUNTER — Other Ambulatory Visit: Payer: Self-pay | Admitting: Medical

## 2018-06-25 LAB — TSH: TSH: 2.34 u[IU]/mL (ref 0.450–4.500)

## 2018-06-25 MED ORDER — SYNTHROID 75 MCG PO TABS
ORAL_TABLET | ORAL | 11 refills | Status: DC
Start: 2018-06-25 — End: 2018-09-07

## 2018-07-20 DIAGNOSIS — B070 Plantar wart: Secondary | ICD-10-CM | POA: Diagnosis not present

## 2018-08-31 ENCOUNTER — Telehealth: Payer: Self-pay | Admitting: Family Medicine

## 2018-08-31 ENCOUNTER — Other Ambulatory Visit: Payer: Self-pay | Admitting: Medical

## 2018-08-31 MED ORDER — ALPRAZOLAM 0.5 MG PO TABS: 0.5000 mg | ORAL_TABLET | Freq: Every day | ORAL | 0 refills | Status: DC

## 2018-08-31 NOTE — Telephone Encounter (Signed)
Med sent.

## 2018-08-31 NOTE — Telephone Encounter (Signed)
Traveling tomorrow, will you please call her in Xanax for the flight?  [facility] [address]. Pt ph [phone]

## 2018-09-05 ENCOUNTER — Other Ambulatory Visit: Payer: Self-pay | Admitting: Medical

## 2018-09-14 ENCOUNTER — Telehealth: Payer: Self-pay | Admitting: Medical

## 2018-09-14 NOTE — Telephone Encounter (Signed)
Requested records from [facility] received sending back for review.

## 2018-10-09 ENCOUNTER — Encounter: Payer: Self-pay | Admitting: Medical

## 2018-10-09 ENCOUNTER — Ambulatory Visit (INDEPENDENT_AMBULATORY_CARE_PROVIDER_SITE_OTHER): Payer: Commercial Managed Care - PPO | Admitting: Medical

## 2018-10-09 VITALS — BP 120/74 | HR 48 | Temp 97.5°F | Resp 16 | Ht 64.0 in | Wt 150.0 lb

## 2018-10-09 DIAGNOSIS — Z7185 Encounter for immunization safety counseling: Secondary | ICD-10-CM

## 2018-10-09 DIAGNOSIS — Z0000 Encounter for general adult medical examination without abnormal findings: Secondary | ICD-10-CM | POA: Diagnosis not present

## 2018-10-09 DIAGNOSIS — Z1159 Encounter for screening for other viral diseases: Secondary | ICD-10-CM | POA: Diagnosis not present

## 2018-10-09 DIAGNOSIS — Z01818 Encounter for other preprocedural examination: Secondary | ICD-10-CM

## 2018-10-09 DIAGNOSIS — Z7189 Other specified counseling: Secondary | ICD-10-CM

## 2018-10-09 DIAGNOSIS — Z113 Encounter for screening for infections with a predominantly sexual mode of transmission: Secondary | ICD-10-CM

## 2018-10-09 DIAGNOSIS — Z13220 Encounter for screening for lipoid disorders: Secondary | ICD-10-CM | POA: Insufficient documentation

## 2018-10-09 DIAGNOSIS — Z23 Encounter for immunization: Secondary | ICD-10-CM | POA: Diagnosis not present

## 2018-10-09 DIAGNOSIS — R9431 Abnormal electrocardiogram [ECG] [EKG]: Secondary | ICD-10-CM

## 2018-10-09 LAB — POCT URINALYSIS DIP (PROADVANTAGE DEVICE)
Bilirubin, UA: NEGATIVE
Glucose, UA: NEGATIVE mg/dL
Ketones, POC UA: NEGATIVE mg/dL
LEUKOCYTES UA: NEGATIVE
NITRITE UA: NEGATIVE
PH UA: 6 (ref 5.0–8.0)
PROTEIN UA: NEGATIVE mg/dL
Specific Gravity, Urine: 1.02
Urobilinogen, Ur: NEGATIVE

## 2018-10-09 NOTE — Patient Instructions (Addendum)
Thanks for trusting [place] with your health care and for coming in for a physical today.  Below are some general recommendations I have for you:  Yearly screenings See your eye doctor yearly for routine vision care. See your dentist yearly for routine dental care including hygiene visits twice yearly. See me here yearly for a routine physical and preventative care visit See your gynecologist yearly for routine gynecological care.   Cancer screening Colon cancer screening:   You are up to date on colonoscopy screening.   You should still have stool card screening to check for blood in the stool every 3-5 years.  Please use the 3 cards given today to smear some poop/fecal matter on each card separately from 3 separate days.  Please mail them back so we can check them for blood         Specific Concerns today:  . Shingles vaccine:  I recommend you have a shingles vaccine to help prevent shingles or herpes zoster outbreak.   Please call your insurer to inquire about coverage for the Shingrix vaccine given in 2 doses.   Some insurers cover this vaccine after age 45, some cover this after age 48.  If your insurer covers this, then call to schedule appointment to have this vaccine here. . Please call cardiology to help get an idea on out of pocket costs as I recommend cardiology consult before surgery.  You likely just need a plain old treadmilll stress test  We use the following offices:  [facility] cardiovascular Address: [address], [place], [place] [address] Phone: [phone]  [facility] Address: [address], [place], [place] [address] Phone: [phone]    Please follow up yearly for a physical.    I have included other useful information below for your review.  Preventative Care for Adults - Female      MAINTAIN REGULAR HEALTH EXAMS:  A routine yearly physical is a good way to check in with your primary care provider about your health and preventive screening.  It is also an opportunity to share updates about your health and any concerns you have, and receive a thorough all-over exam.   Most health insurance companies pay for at least some preventative services.  Check with your health plan for specific coverages.  WHAT PREVENTATIVE SERVICES DO WOMEN NEED?  Adult women should have their weight and blood pressure checked regularly.   Women age 36 and older should have their cholesterol levels checked regularly.  Women should be screened for cervical cancer with a Pap smear and pelvic exam beginning at either age 8, or 3 years after they become sexually activity.    Breast cancer screening generally begins at age 79 with a mammogram and breast exam by your primary care provider.    Beginning at age 33 and continuing to age 35, women should be screened for colorectal cancer.  Certain people may need continued testing until age 49.  Updating vaccinations is part of preventative care.  Vaccinations help protect against diseases such as the flu.  Osteoporosis is a disease in which the bones lose minerals and strength as we age. Women ages 73 and over should discuss this with their caregivers, as should women after menopause who have other risk factors.  Lab tests are generally done as part of preventative care to screen for anemia and blood disorders, to screen for problems with the kidneys and liver, to screen for bladder problems, to check blood sugar, and to  check your cholesterol level.  Preventative services generally include counseling about diet, exercise, avoiding tobacco, drugs, excessive alcohol consumption, and sexually transmitted infections.    GENERAL RECOMMENDATIONS FOR GOOD HEALTH:  Healthy diet:  Eat a variety of foods, including fruit, vegetables, animal or vegetable protein, such as meat, fish, chicken, and eggs, or beans, lentils, tofu, and grains, such as rice.  Drink plenty of water daily.  Decrease saturated fat in the  diet, avoid lots of red meat, processed foods, sweets, fast foods, and fried foods.  Exercise:  Aerobic exercise helps maintain good heart health. At least 30-40 minutes of moderate-intensity exercise is recommended. For example, a brisk walk that increases your heart rate and breathing. This should be done on most days of the week.   Find a type of exercise or a variety of exercises that you enjoy so that it becomes a part of your daily life.  Examples are running, walking, swimming, water aerobics, and biking.  For motivation and support, explore group exercise such as aerobic class, spin class, Zumba, Yoga,or  martial arts, etc.    Set exercise goals for yourself, such as a certain weight goal, walk or run in a race such as a 5k walk/run.  Speak to your primary care provider about exercise goals.  Disease prevention:  If you smoke or chew tobacco, find out from your caregiver how to quit. It can literally save your life, no matter how long you have been a tobacco user. If you do not use tobacco, never begin.   Maintain a healthy diet and normal weight. Increased weight leads to problems with blood pressure and diabetes.   The Body Mass Index or BMI is a way of measuring how much of your body is fat. Having a BMI above 27 increases the risk of heart disease, diabetes, hypertension, stroke and other problems related to obesity. Your caregiver can help determine your BMI and based on it develop an exercise and dietary program to help you achieve or maintain this important measurement at a healthful level.  High blood pressure causes heart and blood vessel problems.  Persistent high blood pressure should be treated with medicine if weight loss and exercise do not work.   Fat and cholesterol leaves deposits in your arteries that can block them. This causes heart disease and vessel disease elsewhere in your body.  If your cholesterol is found to be high, or if you have heart disease or certain other  medical conditions, then you may need to have your cholesterol monitored frequently and be treated with medication.   Ask if you should have a cardiac stress test if your history suggests this. A stress test is a test done on a treadmill that looks for heart disease. This test can find disease prior to there being a problem.  Menopause can be associated with physical symptoms and risks. Hormone replacement therapy is available to decrease these. You should talk to your caregiver about whether starting or continuing to take hormones is right for you.   Osteoporosis is a disease in which the bones lose minerals and strength as we age. This can result in serious bone fractures. Risk of osteoporosis can be identified using a bone density scan. Women ages 14 and over should discuss this with their caregivers, as should women after menopause who have other risk factors. Ask your caregiver whether you should be taking a calcium supplement and Vitamin D, to reduce the rate of osteoporosis.   Avoid drinking alcohol  in excess (more than two drinks per day).  Avoid use of street drugs. Do not share needles with anyone. Ask for professional help if you need assistance or instructions on stopping the use of alcohol, cigarettes, and/or drugs.  Brush your teeth twice a day with fluoride toothpaste, and floss once a day. Good oral hygiene prevents tooth decay and gum disease. The problems can be painful, unattractive, and can cause other health problems. Visit your dentist for a routine oral and dental check up and preventive care every 6-12 months.   Look at your skin regularly.  Use a mirror to look at your back. Notify your caregivers of changes in moles, especially if there are changes in shapes, colors, a size larger than a pencil eraser, an irregular border, or development of new moles.  Safety:  Use seatbelts 100% of the time, whether driving or as a passenger.  Use safety devices such as hearing protection  if you work in environments with loud noise or significant background noise.  Use safety glasses when doing any work that could send debris in to the eyes.  Use a helmet if you ride a bike or motorcycle.  Use appropriate safety gear for contact sports.  Talk to your caregiver about gun safety.  Use sunscreen with a SPF (or skin protection factor) of 15 or greater.  Lighter skinned people are at a greater risk of skin cancer. Don't forget to also wear sunglasses in order to protect your eyes from too much damaging sunlight. Damaging sunlight can accelerate cataract formation.   Practice safe sex. Use condoms. Condoms are used for birth control and to help reduce the spread of sexually transmitted infections (or STIs).  Some of the STIs are gonorrhea (the clap), chlamydia, syphilis, trichomonas, herpes, HPV (human papilloma virus) and HIV (human immunodeficiency virus) which causes AIDS. The herpes, HIV and HPV are viral illnesses that have no cure. These can result in disability, cancer and death.   Keep carbon monoxide and smoke detectors in your home functioning at all times. Change the batteries every 6 months or use a model that plugs into the wall.   Vaccinations:  Stay up to date with your tetanus shots and other required immunizations. You should have a booster for tetanus every 10 years. Be sure to get your flu shot every year, since 5%-20% of the [place] population comes down with the flu. The flu vaccine changes each year, so being vaccinated once is not enough. Get your shot in the fall, before the flu season peaks.   Other vaccines to consider:  Human Papilloma Virus or HPV causes cancer of the cervix, and other infections that can be transmitted from person to person. There is a vaccine for HPV, and females should get immunized between the ages of 38 and 42. It requires a series of 3 shots.   Pneumococcal vaccine to protect against certain types of pneumonia.  This is normally recommended for  adults age 58 or older.  However, adults younger than 55 years old with certain underlying conditions such as diabetes, heart or lung disease should also receive the vaccine.  Shingles vaccine to protect against Varicella Zoster if you are older than age 61, or younger than 55 years old with certain underlying illness.  If you have not had the Shingrix vaccine, please call your insurer to inquire about coverage for the Shingrix vaccine given in 2 doses.   Some insurers cover this vaccine after age 85, some cover this after  age 28.  If your insurer covers this, then call to schedule appointment to have this vaccine here  Hepatitis A vaccine to protect against a form of infection of the liver by a virus acquired from food.  Hepatitis B vaccine to protect against a form of infection of the liver by a virus acquired from blood or body fluids, particularly if you work in health care.  If you plan to travel internationally, check with your local health department for specific vaccination recommendations.  Cancer Screening:  Breast cancer screening is essential to preventive care for women. All women age 61 and older should perform a breast self-exam every month. At age 84 and older, women should have their caregiver complete a breast exam each year. Women at ages 46 and older should have a mammogram (x-ray film) of the breasts. Your caregiver can discuss how often you need mammograms.    Cervical cancer screening includes taking a Pap smear (sample of cells examined under a microscope) from the cervix (end of the uterus). It also includes testing for HPV (Human Papilloma Virus, which can cause cervical cancer). Screening and a pelvic exam should begin at age 4, or 3 years after a woman becomes sexually active. Screening should occur every year, with a Pap smear but no HPV testing, up to age 2. After age 72, you should have a Pap smear every 3 years with HPV testing, if no HPV was found previously.   Most  routine colon cancer screening begins at the age of 62. On a yearly basis, doctors may provide special easy to use take-home tests to check for hidden blood in the stool. Sigmoidoscopy or colonoscopy can detect the earliest forms of colon cancer and is life saving. These tests use a small camera at the end of a tube to directly examine the colon. Speak to your caregiver about this at age 67, when routine screening begins (and is repeated every 5 years unless early forms of pre-cancerous polyps or small growths are found).

## 2018-10-09 NOTE — Progress Notes (Addendum)
Subjective:   HPI  Colleen Berry is a 53 y.o. female who presents for Chief Complaint  Patient presents with  . consult    surgery clearence  patient was on her period.     Here for physical/preop.  Having liposuction in [place] soon.  Fasting today, no other concerns   Medical care team includes: Casy Tavano, Camelia Eng, PA-C here for primary care Dentist Eye doctor Gynecology, Dr. Benjie Karvonen Dermatology GI, Dr. Juanita Craver  Reviewed their medical, surgical, family, social, medication, and allergy history and updated chart as appropriate.  Past Medical History:  Diagnosis Date  . Allergic rhinitis    dust  . Anxiety    fear of flight, public speaking  . Dysthymia   . Gastritis   . GERD (gastroesophageal reflux disease)   . Hypothyroidism   . Vitamin D deficiency     Past Surgical History:  Procedure Laterality Date  . COLONOSCOPY  07/2014   normal, Dr. Juanita Craver  . KNEE SURGERY     multiple  . OOPHORECTOMY  07/2017   Dr. Benjie Karvonen    Social History   Socioeconomic History  . Marital status: Single    Spouse name: Not on file  . Number of children: Not on file  . Years of education: Not on file  . Highest education level: Not on file  Occupational History  . Not on file  Social Needs  . Financial resource strain: Not on file  . Food insecurity:    Worry: Not on file    Inability: Not on file  . Transportation needs:    Medical: Not on file    Non-medical: Not on file  Tobacco Use  . Smoking status: Never Smoker  . Smokeless tobacco: Never Used  Substance and Sexual Activity  . Alcohol use: Yes    Alcohol/week: 10.0 standard drinks    Types: 10 Glasses of wine per week  . Drug use: No  . Sexual activity: Not on file  Lifestyle  . Physical activity:    Days per week: Not on file    Minutes per session: Not on file  . Stress: Not on file  Relationships  . Social connections:    Talks on phone: Not on file    Gets together: Not on file    Attends  religious service: Not on file    Active member of club or organization: Not on file    Attends meetings of clubs or organizations: Not on file    Relationship status: Not on file  . Intimate partner violence:    Fear of current or ex partner: Not on file    Emotionally abused: Not on file    Physically abused: Not on file    Forced sexual activity: Not on file  Other Topics Concern  . Not on file  Social History Narrative   Single, lives alone, [employer] few times per month, some walking, moved from [place] for employment.   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 Cancer Mother        myelodysplasia  . Pneumonia Mother        died of pneumonia  . Heart disease Father        odd tumor  . Hypertension Father   . Diabetes Maternal Grandmother   . Cancer Cousin        ovarian  . Stroke Neg Hx  Current Outpatient Medications:  .  ALPRAZolam (XANAX) 0.5 MG tablet, Take 1 tablet (0.5 mg total) by mouth at bedtime. prn, Disp: 30 tablet, Rfl: 0 .  estradiol (VIVELLE-DOT) 0.025 MG/24HR, Place 1 patch onto the skin daily., Disp: , Rfl:  .  progesterone (PROMETRIUM) 100 MG capsule, Take 100 mg by mouth daily., Disp: , Rfl:  .  SYNTHROID 75 MCG tablet, TAKE 1 TABLET(75 MCG) BY MOUTH DAILY, Disp: 90 tablet, Rfl: 0 .  triamcinolone (NASACORT AQ) 55 MCG/ACT nasal inhaler, Place 2 sprays into the nose daily. Reported on 01/30/2016, Disp: , Rfl:  .  diazepam (VALIUM) 10 MG tablet, Take 1/2 -1 tablet PRN for flight anxiety (Patient not taking: Reported on 06/24/2018), Disp: 15 tablet, Rfl: 0 .  fexofenadine (ALLEGRA) 30 MG tablet, Take 30 mg by mouth 2 (two) times daily. Reported on 01/30/2016, Disp: , Rfl:   Allergies  Allergen Reactions  . Other     dust      Review of Systems Constitutional: -fever, -chills, -sweats, -unexpected weight change, -decreased appetite, -fatigue Allergy: -sneezing, -itching, -congestion Dermatology:  +changing moles, --rash, -lumps ENT: -runny nose, -ear pain, -sore throat, -hoarseness, -sinus pain, -teeth pain, - ringing in ears, -hearing loss, -nosebleeds Cardiology: -chest pain, -palpitations, -swelling, -difficulty breathing when lying flat, -waking up short of breath Respiratory: -cough, -shortness of breath, -difficulty breathing with exercise or exertion, -wheezing, -coughing up blood Gastroenterology: -abdominal pain, -nausea, -vomiting, -diarrhea, -constipation, -blood in stool, -changes in bowel movement, -difficulty swallowing or eating Hematology: -bleeding, -bruising  Musculoskeletal: -joint aches, -muscle aches, -joint swelling, -back pain, -neck pain, -cramping, -changes in gait Ophthalmology: denies vision changes, eye redness, itching, discharge Urology: -burning with urination, -difficulty urinating, -blood in urine, -urinary frequency, -urgency, -incontinence Neurology: -headache, -weakness, -tingling, -numbness, -memory loss, -falls, -dizziness Psychology: -depressed mood, -agitation, -sleep problems Breast/gyn: -breast tenderness, -discharge, -lumps, -vaginal discharge,- irregular periods, -heavy periods     Objective:  BP 120/74   Pulse (!) 48   Temp (!) 97.5 F (36.4 C) (Oral)   Resp 16   Ht 5\' 4"  (1.626 m)   Wt 150 lb (68 kg)   LMP 10/08/2018 (Exact Date)   SpO2 98%   BMI 25.75 kg/m   General appearance: alert, no distress, WD/WN, Caucasian female Skin: right forehead with 40mm raised flesh colored lesion, scattered cherry hemangiomas of upper back, upper chest, no other worrisome lesions HEENT: normocephalic, conjunctiva/corneas normal, sclerae anicteric, PERRLA, EOMi, nares patent, no discharge or erythema, pharynx normal Oral cavity: MMM, tongue normal, teeth in good repair Neck: supple, no lymphadenopathy, no thyromegaly, no masses, normal ROM, no bruits Chest: non tender, normal shape and expansion Heart: RRR, normal S1, S2, no murmurs Lungs: CTA  bilaterally, no wheezes, rhonchi, or rales Abdomen: +bs, soft, non tender, non distended, no masses, no hepatomegaly, no splenomegaly, no bruits Back: non tender, normal ROM, no scoliosis Musculoskeletal: upper extremities non tender, no obvious deformity, normal ROM throughout, lower extremities non tender, no obvious deformity, normal ROM throughout Extremities: no edema, no cyanosis, no clubbing Pulses: 2+ symmetric, upper and lower extremities, normal cap refill Neurological: alert, oriented x 3, CN2-12 intact, strength normal upper extremities and lower extremities, sensation normal throughout, DTRs 2+ throughout, no cerebellar signs, gait normal Psychiatric: normal affect, behavior normal, pleasant  Breast/gyn/rectal - deferred to gynecology  EKG  indication physical, rate 47 bpm, PR 200 ms, QRS 80 ms, QTC 366 ms, 20 degrees axis, marked sinus bradycardia, low voltage QRS, poor R wave progression in precordial  leads, nonspecific T wave inversion   Assessment and Plan :   Encounter Diagnoses  Name Primary?  . Encounter for health maintenance examination in adult Yes  . Need for Tdap vaccination   . Pre-op exam   . Encounter for hepatitis C screening test for low risk patient   . Need for Td vaccine   . Vaccine counseling   . Abnormal EKG   . Screen for STD (sexually transmitted disease)     Physical exam - discussed and counseled on healthy lifestyle, diet, exercise, preventative care, vaccinations, sick and well care, proper use of emergency dept and after hours care, and addressed their concerns.    Health screening: Advised they see their eye doctor yearly for routine vision care. Advised they see their dentist yearly for routine dental care including hygiene visits twice yearly. See your gynecologist yearly for routine gynecological care.  Discussed STD testing, discussed prevention, condom use, means of transmission  Cancer screening Counseled on self breast exams,  mammograms, cervical cancer screening  Colonoscopy:  Reviewed colonoscopy on file that is up to date  Vaccinations: Advised yearly influenza vaccine  Counseled on the Td (tetanus, diptheria) vaccine.  Vaccine information sheet given. Td vaccine given after consent obtained.   Separate significant issues discussed: I advised that she needs to see cardiology for clearance.  She has a high deductible insurance plan so she will call cardiology to get an idea of cost.  Pending labs and cardiology consult weekend finalize surgery clearance   Evva was seen today for consult.  Diagnoses and all orders for this visit:  Encounter for health maintenance examination in adult -     Comprehensive metabolic panel -     CBC -     EKG 12-Lead -     HIV Antibody (routine testing w rflx) -     Hepatitis C antibody -     RPR -     GC/Chlamydia Probe Amp  Need for Tdap vaccination  Pre-op exam -     POCT Urinalysis DIP (Proadvantage Device) -     EKG 12-Lead  Encounter for hepatitis C screening test for low risk patient -     Hepatitis C antibody  Need for Td vaccine -     Td : Tetanus/diphtheria >7yo Preservative  free  Vaccine counseling  Abnormal EKG  Screen for STD (sexually transmitted disease) -     RPR -     GC/Chlamydia Probe Amp    Follow-up pending labs, yearly for physical

## 2018-10-09 NOTE — Addendum Note (Signed)
Addended by: Carlena Hurl on: 10/09/2018 02:03 PM   Modules accepted: Orders

## 2018-10-10 LAB — COMPREHENSIVE METABOLIC PANEL
ALT: 10 IU/L (ref 0–32)
AST: 17 IU/L (ref 0–40)
Albumin/Globulin Ratio: 1.8 (ref 1.2–2.2)
Albumin: 4.8 g/dL (ref 3.8–4.9)
Alkaline Phosphatase: 53 IU/L (ref 39–117)
BUN/Creatinine Ratio: 22 (ref 9–23)
BUN: 17 mg/dL (ref 6–24)
Bilirubin Total: 0.2 mg/dL (ref 0.0–1.2)
CO2: 22 mmol/L (ref 20–29)
Calcium: 10 mg/dL (ref 8.7–10.2)
Chloride: 100 mmol/L (ref 96–106)
Creatinine, Ser: 0.76 mg/dL (ref 0.57–1.00)
GFR calc Af Amer: 103 mL/min/{1.73_m2} (ref >59)
GFR calc non Af Amer: 89 mL/min/{1.73_m2} (ref >59)
Globulin, Total: 2.6 g/dL (ref 1.5–4.5)
Glucose: 80 mg/dL (ref 65–99)
Potassium: 4.7 mmol/L (ref 3.5–5.2)
SODIUM: 138 mmol/L (ref 134–144)
Total Protein: 7.4 g/dL (ref 6.0–8.5)

## 2018-10-10 LAB — RPR: RPR Ser Ql: NONREACTIVE

## 2018-10-10 LAB — HEPATITIS C ANTIBODY: Hep C Virus Ab: <0.1 s/co ratio (ref 0.0–0.9)

## 2018-10-10 LAB — CBC
Hematocrit: 38.9 % (ref 34.0–46.6)
Hemoglobin: 13.7 g/dL (ref 11.1–15.9)
MCH: 33.1 pg — ABNORMAL HIGH (ref 26.6–33.0)
MCHC: 35.2 g/dL (ref 31.5–35.7)
MCV: 94 fL (ref 79–97)
PLATELETS: 268 10*3/uL (ref 150–450)
RBC: 4.14 x10E6/uL (ref 3.77–5.28)
RDW: 12.9 % (ref 11.7–15.4)
WBC: 5.2 10*3/uL (ref 3.4–10.8)

## 2018-10-10 LAB — HIV ANTIBODY (ROUTINE TESTING W REFLEX): HIV Screen 4th Generation wRfx: NONREACTIVE

## 2018-10-11 LAB — GC/CHLAMYDIA PROBE AMP
Chlamydia trachomatis, NAA: NEGATIVE
Neisseria gonorrhoeae by PCR: NEGATIVE

## 2018-10-12 ENCOUNTER — Other Ambulatory Visit: Payer: Self-pay

## 2018-10-12 DIAGNOSIS — Z01818 Encounter for other preprocedural examination: Secondary | ICD-10-CM

## 2018-10-12 DIAGNOSIS — R9431 Abnormal electrocardiogram [ECG] [EKG]: Secondary | ICD-10-CM

## 2018-10-16 ENCOUNTER — Telehealth: Payer: Self-pay | Admitting: Medical

## 2018-10-16 NOTE — Telephone Encounter (Signed)
Pt called and is wanting to come in to have her LTA and her cholesterol checked states she is suppose to go the cardiologist dr next week but wants to have this done before going to see them, she wants to know if she can have this done without coming in to see you, pt scheduled a appt to come in Monday but if she can have this do with out coming in for a appt she would rather do that, also she wants to com in today to do this pt can be reached at [phone], please send to cyndi as I will not be here after lunch

## 2018-10-16 NOTE — Telephone Encounter (Signed)
Fine to have fasting lipids, but what is LTA?

## 2018-10-19 ENCOUNTER — Institutional Professional Consult (permissible substitution): Payer: Commercial Managed Care - PPO | Admitting: Medical

## 2018-10-21 NOTE — Telephone Encounter (Signed)
Called and left pt message to come in for labs

## 2018-10-22 NOTE — Progress Notes (Signed)
Patient referred by Carlena Hurl, PA-C for Prte-op evaluation  Subjective:   Colleen Berry, female    DOB: [birth year], 53 y.o.   MRN: [id number]   Chief Complaint  Patient presents with  . Pre-op Exam     HPI  53 y.o. Caucasian female, referred for preop evaluation given concerns for abnormal EKG.  Patient is going to undergo liposuction surgery, likely under general anesthesia.  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  She exercises regularly at [facility] [employer] 4 times a month, and 3 mile hikes several times a month.  With this level of activity, she denies chest pain, shortness of breath, palpitations, leg edema, orthopnea, PND, TIA/syncope.  She reports that her father had a benign heart tumor.  He had an attempted surgery but the tumor could not be removed. He was noted to have coronary artery disease during the workup.  She is concerned about her risk of coronary artery disease and requests if I could check her lipid panel and lipoprotein a.   Past Medical History:  Diagnosis Date  . Allergic rhinitis    dust  . Anxiety    fear of flight, public speaking  . Dysthymia   . Gastritis   . GERD (gastroesophageal reflux disease)   . Hypothyroidism   . Vitamin D deficiency      Past Surgical History:  Procedure Laterality Date  . COLONOSCOPY  07/2014   normal, Dr. Juanita Craver  . KNEE SURGERY     multiple  . OOPHORECTOMY  07/2017   Dr. Benjie Karvonen     Social History   Socioeconomic History  . Marital status: Single    Spouse name: Not on file  . Number of children: Not on file  . Years of education: Not on file  . Highest education level: Not on file  Occupational History  . Not on file  Social Needs  . Financial resource strain: Not on file  . Food insecurity:    Worry: Not on file    Inability: Not on file  . Transportation needs:    Medical: Not on file    Non-medical: Not on file  Tobacco Use  .  Smoking status: Never Smoker  . Smokeless tobacco: Never Used  Substance and Sexual Activity  . Alcohol use: Yes    Alcohol/week: 10.0 standard drinks    Types: 10 Glasses of wine per week  . Drug use: No  . Sexual activity: Not on file  Lifestyle  . Physical activity:    Days per week: Not on file    Minutes per session: Not on file  . Stress: Not on file  Relationships  . Social connections:    Talks on phone: Not on file    Gets together: Not on file    Attends religious service: Not on file    Active member of club or organization: Not on file    Attends meetings of clubs or organizations: Not on file    Relationship status: Not on file  . Intimate partner violence:    Fear of current or ex partner: Not on file    Emotionally abused: Not on file    Physically abused: Not on file    Forced sexual activity: Not on file  Other Topics Concern  . Not on file  Social History Narrative   Single, lives alone, [employer] few times per month, some walking, moved from [place]  for employment.   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 fexofenadine (ALLEGRA) 30 MG tablet Take 30 mg by mouth daily as needed. Reported on 01/30/2016    . progesterone (PROMETRIUM) 100 MG capsule Take 100 mg by mouth daily.    Marland Kitchen SYNTHROID 75 MCG tablet TAKE 1 TABLET(75 MCG) BY MOUTH DAILY 90 tablet 0  . triamcinolone (NASACORT AQ) 55 MCG/ACT nasal inhaler Place 2 sprays into the nose daily. Reported on 01/30/2016    . ALPRAZolam (XANAX) 0.5 MG tablet Take 1 tablet (0.5 mg total) by mouth at bedtime. prn (Patient not taking: Reported on 10/23/2018) 30 tablet 0  . diazepam (VALIUM) 10 MG tablet Take 1/2 -1 tablet PRN for flight anxiety (Patient not taking: Reported on 06/24/2018) 15 tablet 0  . estradiol (VIVELLE-DOT) 0.025 MG/24HR Place 1 patch onto the skin daily.     No current facility-administered medications on file prior  to visit.     Cardiovascular studies:  EKG 10/23/2018: Sinus rhythm 58 bpm -RSR(V1) -nondiagnostic.  Otherwise normal EKG  EKG 10/09/2018: non-specific T-wave inversion. Poor R-Wave progression.   Recent labs:   CBC    Component Value Date/Time   WBC 5.2 10/09/2018 1215   WBC 7.2 06/16/2017 1253   RBC 4.14 10/09/2018 1215   RBC 4.30 06/16/2017 1253   HGB 13.7 10/09/2018 1215   HCT 38.9 10/09/2018 1215   PLT 268 10/09/2018 1215   MCV 94 10/09/2018 1215   MCH 33.1 (H) 10/09/2018 1215   MCH 31.4 06/16/2017 1253   MCHC 35.2 10/09/2018 1215   MCHC 34.3 06/16/2017 1253   RDW 12.9 10/09/2018 1215   LYMPHSABS 1,699 06/16/2017 1253   MONOABS 708 01/27/2017 0851   EOSABS 22 06/16/2017 1253   BASOSABS 50 06/16/2017 1253   CMP     Component Value Date/Time   NA 138 10/09/2018 1215   K 4.7 10/09/2018 1215   CL 100 10/09/2018 1215   CO2 22 10/09/2018 1215   GLUCOSE 80 10/09/2018 1215   GLUCOSE 87 01/27/2017 0851   BUN 17 10/09/2018 1215   CREATININE 0.76 10/09/2018 1215   CREATININE 0.83 01/27/2017 0851   CALCIUM 10.0 10/09/2018 1215   PROT 7.4 10/09/2018 1215   ALBUMIN 4.8 10/09/2018 1215   AST 17 10/09/2018 1215   ALT 10 10/09/2018 1215   ALKPHOS 53 10/09/2018 1215   BILITOT 0.2 10/09/2018 1215   GFRNONAA 89 10/09/2018 1215   GFRAA 103 10/09/2018 1215   Lipid Panel     Component Value Date/Time   CHOL 182 02/15/2015 0001   TRIG 95 02/15/2015 0001   HDL 68 02/15/2015 0001   CHOLHDL 2.7 02/15/2015 0001   VLDL 19 02/15/2015 0001   LDLCALC 130 12/25/2016     Review of Systems  Constitution: Negative. Negative for fever, malaise/fatigue, weight gain and weight loss.  HENT: Negative for sore throat.   Eyes: Negative for visual disturbance.  Cardiovascular: Negative.  Negative for chest pain, claudication, dyspnea on exertion, leg swelling, palpitations and syncope.  Respiratory: Negative.  Negative for cough and shortness of breath.   Hematologic/Lymphatic:  Negative.   Skin: Negative.   Musculoskeletal: Negative for back pain, joint swelling and muscle weakness.  Gastrointestinal: Negative for abdominal pain, change in bowel habit, nausea and vomiting.  Neurological: Negative.   Psychiatric/Behavioral: Negative for depression and substance abuse.  All other systems reviewed and are negative.  Vitals:   10/23/18 1048  BP: 112/83  Pulse: 67  SpO2: 100%    Objective:   Physical Exam  Constitutional: She appears well-developed and well-nourished. No distress.  HENT:  Head: Atraumatic.  Eyes: Conjunctivae are normal.  Neck: Neck supple. No JVD present. No thyromegaly present.  Cardiovascular: Normal rate, regular rhythm, normal heart sounds and intact distal pulses. Exam reveals no gallop.  No murmur heard. Pulmonary/Chest: Effort normal and breath sounds normal.  Abdominal: Soft. Bowel sounds are normal.  Musculoskeletal: Normal range of motion.        General: No edema.  Neurological: She is alert.  Skin: Skin is warm and dry.  Psychiatric: She has a normal mood and affect.          Assessment & Recommendations:   53 y.o. Caucasian female, referred for preop evaluation given concerns for abnormal EKG.  1. Pre-op evaluation Low cardiac risk. Okay to proceed  2. Abnormal ECG Prior abnormal EKG likely due to lead placement. EKG today is normal.  3. 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 Patient would like to hold off for now. Will obtain lipid panel and Lp(a) Will check echocardiogram given her family history of benign heart tumor     Thank you for referring the patient to [place]. Please feel free to contact with any questions.  Nigel Mormon, MD [facility] Pager: [phone] Office: [phone] If no answer Cell [phone]

## 2018-10-23 ENCOUNTER — Encounter: Payer: Self-pay | Admitting: Cardiology

## 2018-10-23 ENCOUNTER — Ambulatory Visit (INDEPENDENT_AMBULATORY_CARE_PROVIDER_SITE_OTHER): Payer: Commercial Managed Care - PPO | Admitting: Cardiology

## 2018-10-23 ENCOUNTER — Telehealth: Payer: Self-pay | Admitting: Medical

## 2018-10-23 VITALS — BP 112/83 | HR 67 | Ht 63.0 in | Wt 145.0 lb

## 2018-10-23 DIAGNOSIS — Z01818 Encounter for other preprocedural examination: Secondary | ICD-10-CM | POA: Diagnosis not present

## 2018-10-23 DIAGNOSIS — Z8249 Family history of ischemic heart disease and other diseases of the circulatory system: Secondary | ICD-10-CM | POA: Diagnosis not present

## 2018-10-23 DIAGNOSIS — R9431 Abnormal electrocardiogram [ECG] [EKG]: Secondary | ICD-10-CM

## 2018-10-23 DIAGNOSIS — Z13220 Encounter for screening for lipoid disorders: Secondary | ICD-10-CM | POA: Diagnosis not present

## 2018-10-23 NOTE — Telephone Encounter (Signed)
PT came in and dropped off lab orders from Dr. Virgina Jock. Please advise pt at [phone]. Sending back to [facility].

## 2018-10-23 NOTE — Telephone Encounter (Signed)
Patient has been scheduled for Monday.

## 2018-10-26 ENCOUNTER — Other Ambulatory Visit: Payer: Commercial Managed Care - PPO

## 2018-10-26 DIAGNOSIS — Z13220 Encounter for screening for lipoid disorders: Secondary | ICD-10-CM

## 2018-10-26 DIAGNOSIS — Z8249 Family history of ischemic heart disease and other diseases of the circulatory system: Secondary | ICD-10-CM

## 2018-10-27 ENCOUNTER — Telehealth: Payer: Self-pay

## 2018-10-27 LAB — LIPID PANEL
Chol/HDL Ratio: 3.5 ratio (ref 0.0–4.4)
Cholesterol, Total: 274 mg/dL — ABNORMAL HIGH (ref 100–199)
HDL: 79 mg/dL (ref >39)
LDL Calculated: 170 mg/dL — ABNORMAL HIGH (ref 0–99)
Triglycerides: 125 mg/dL (ref 0–149)
VLDL Cholesterol Cal: 25 mg/dL (ref 5–40)

## 2018-10-27 LAB — LIPOPROTEIN A (LPA): Lipoprotein (a): 11 nmol/L (ref <75.0)

## 2018-10-27 NOTE — Telephone Encounter (Signed)
Patient called stating she needs her surgical clearance sent to her surgeon.  She states that cardiology said her EKG was normal.

## 2018-10-28 NOTE — Telephone Encounter (Signed)
Awaiting echocardiogram which I think is scheuled for 10/29/18.

## 2018-10-28 NOTE — Telephone Encounter (Signed)
Called the patient to discuss results. Will try again. LDL 170, HDL 79. ASCVD risk 1.4%. She lives active and healthy lifestyle. I do not think any further diet and lifestyle modifications would reduce her cholesterol. Consider starting a statin. Will discuss in detail when I speak with the patient.

## 2018-10-28 NOTE — Telephone Encounter (Signed)
Pt called wanting to know if her blood work has been reviewed.  [employer]

## 2018-10-29 ENCOUNTER — Ambulatory Visit: Payer: Commercial Managed Care - PPO

## 2018-10-29 ENCOUNTER — Other Ambulatory Visit: Payer: Self-pay

## 2018-10-29 ENCOUNTER — Telehealth: Payer: Self-pay | Admitting: *Deleted

## 2018-10-29 DIAGNOSIS — Z01818 Encounter for other preprocedural examination: Secondary | ICD-10-CM | POA: Diagnosis not present

## 2018-10-29 NOTE — Telephone Encounter (Signed)
Patient called to follow up on medical clearance form from Dr. Barrett Shell office in [place]. States she dropped off form 2 days ago. Wanted to see if it had been filled out and/or faxed a she is scheduled for surgery next week. Thanks.

## 2018-10-29 NOTE — Telephone Encounter (Signed)
Spoke with patient and let her know.  

## 2018-10-29 NOTE — Telephone Encounter (Signed)
Let her know that I am working on these papers

## 2018-10-30 ENCOUNTER — Telehealth: Payer: Self-pay

## 2018-11-03 ENCOUNTER — Other Ambulatory Visit: Payer: Commercial Managed Care - PPO

## 2018-11-03 NOTE — Progress Notes (Signed)
Called Pt and made her aware.

## 2018-11-30 ENCOUNTER — Telehealth: Payer: Self-pay | Admitting: Family Medicine

## 2018-11-30 NOTE — Telephone Encounter (Signed)
Pt having mild panic attacks.  Wants a refill on her Xanax and Valium, maybe 10 each.  She states the combo works well. [employer]

## 2018-12-02 ENCOUNTER — Other Ambulatory Visit: Payer: Self-pay | Admitting: Medical

## 2018-12-02 MED ORDER — ALPRAZOLAM 0.5 MG PO TABS: 0.5000 mg | ORAL_TABLET | Freq: Every day | ORAL | 0 refills | Status: DC

## 2018-12-02 NOTE — Telephone Encounter (Signed)
I sent a refill on Xanax.  The Valium was initially prescribed for long flights for flight anxiety.  This should not be used as a regular treatment to help with anxiety.  Thus if she needs to use something for acute panic attack or feeling anxious past where the Xanax can be helpful.  If she feels anxious every day then we should look at some other treatment recommendations.  Certainly I recommend regular exercise, relaxation breathing exercises as well.  There are apps on the Smart Phone for Relaxation and Breathing Exercises  If she is okay with this then fine if not recommend a virtual tele-visit

## 2018-12-02 NOTE — Telephone Encounter (Signed)
Patient states that is fine.   She is having it with job interviews through zoom only.

## 2019-02-16 ENCOUNTER — Telehealth: Payer: Self-pay | Admitting: Medical

## 2019-02-16 ENCOUNTER — Other Ambulatory Visit: Payer: Self-pay | Admitting: Medical

## 2019-02-16 LAB — HM MAMMOGRAPHY

## 2019-02-16 MED ORDER — ALPRAZOLAM 0.5 MG PO TABS: 0.5000 mg | ORAL_TABLET | Freq: Every day | ORAL | 0 refills | Status: DC

## 2019-02-16 MED ORDER — DIAZEPAM 10 MG PO TABS: ORAL_TABLET | ORAL | 0 refills | Status: AC

## 2019-02-16 NOTE — Telephone Encounter (Signed)
Pt called and is requesting a refill on xanax states she is flying to see her father and gets anxiety when she fly's and and would like something to help her out, pt uses [facility], [address] AT [address] and pt can be reached at [phone]

## 2019-03-09 ENCOUNTER — Other Ambulatory Visit: Payer: Self-pay

## 2019-03-09 ENCOUNTER — Ambulatory Visit (INDEPENDENT_AMBULATORY_CARE_PROVIDER_SITE_OTHER): Payer: Self-pay | Admitting: Medical

## 2019-03-09 ENCOUNTER — Encounter: Payer: Self-pay | Admitting: Medical

## 2019-03-09 VITALS — BP 120/80 | HR 70 | Temp 98.3°F | Resp 16 | Ht 63.5 in | Wt 141.0 lb

## 2019-03-09 DIAGNOSIS — F341 Dysthymic disorder: Secondary | ICD-10-CM

## 2019-03-09 MED ORDER — FLUOXETINE HCL 20 MG PO TABS: 20.0000 mg | ORAL_TABLET | Freq: Every day | ORAL | 1 refills | Status: DC

## 2019-03-09 NOTE — Progress Notes (Signed)
Subjective: Chief Complaint  Patient presents with  . depression    mild depression    Here for concern about depression.   She notes hx/o dysthymia/depression.  Has always been a little on the down side with mood.   When she works full time, around people, interacting with people, usually not as bad.  However in recent months with the pandemic, she lost her job and has not been able to work and be around people.  Thus she has been more down in her mood.  She has been more listless, losing interest in doing the normal things she likes to do.  She tries to plan her day, sets goals but sometimes just does not have the desire to address for get things done.  She denies major mood swings, denies hallucinations, no history of bipolar disorder no history of schizophrenia.  She does not feel anxious.  Overall she is in good financial shape.  She denies suicidal ideation or homicidal ideation.  She has been on a couple different medicines including Prozac Lexapro and Wellbutrin in the past.  She currently is on hormone therapy for menopausal symptoms.  She did the best on Prozac and was on this for years in her 24s.  She denies any particular side effects with medications in the past.  She would like to go back on Prozac at this time.  She is exercising regularly, using a monitor to check her steps averaging about 8000 steps per day.  Trying to lose down to about 135 pounds.  She is working towards her goal with that and doing quite well.  She does interview 2 different therapist and has decided to start seeing Colleen Berry therapist here in [place].  Doing freelance work, applying for jobs.   Being more strategic about job hunting.    She saw a psychiatrist in the past 2010, diagnosed with dysthymia.  No other new c/o.   Past Medical History:  Diagnosis Date  . Allergic rhinitis    dust  . Anxiety    fear of flight, public speaking  . Dysthymia   . Gastritis   . GERD (gastroesophageal reflux  disease)   . Hypothyroidism   . Vitamin D deficiency    Current Outpatient Medications on File Prior to Visit  Medication Sig Dispense Refill  . ALPRAZolam (XANAX) 0.5 MG tablet Take 1 tablet (0.5 mg total) by mouth at bedtime. prn 30 tablet 0  . diazepam (VALIUM) 10 MG tablet Take 1/2 -1 tablet PRN for flight anxiety 15 tablet 0  . estradiol (VIVELLE-DOT) 0.025 MG/24HR Place 1 patch onto the skin daily.    . fexofenadine (ALLEGRA) 30 MG tablet Take 30 mg by mouth daily as needed. Reported on 01/30/2016    . progesterone (PROMETRIUM) 100 MG capsule Take 100 mg by mouth daily.    Marland Kitchen SYNTHROID 75 MCG tablet TAKE 1 TABLET(75 MCG) BY MOUTH DAILY 90 tablet 0  . triamcinolone (NASACORT AQ) 55 MCG/ACT nasal inhaler Place 2 sprays into the nose daily. Reported on 01/30/2016     No current facility-administered medications on file prior to visit.    ROS as in subjective    Objective: BP 120/80   Pulse 70   Temp 98.3 F (36.8 C) (Oral)   Resp 16   Ht 5' 3.5" (1.613 m)   Wt 141 lb (64 kg)   SpO2 98%   BMI 24.59 kg/m   Wt Readings from Last 3 Encounters:  03/09/19 141 lb (64 kg)  10/23/18 145 lb (65.8 kg)  10/09/18 150 lb (68 kg)    Gen: wd, wn, pleasant white female Answers questions appropriately, good eye contact    Assessment: Encounter Diagnosis  Name Primary?  . Dysthymia Yes     Plan: Begin trial of Prozac, discussed risk and benefits of medication.  Continue to monitor weight.  She is working towards goal of 135 pounds.   Glad to hear she is established with a counselor Colleen Berry.   I asked her to follow-up in 2 weeks with a call or MyChart message, otherwise recheck in 1 month.    Colleen Berry was seen today for depression.  Diagnoses and all orders for this visit:  Dysthymia  Other orders -     FLUoxetine (PROZAC) 20 MG tablet; Take 1 tablet (20 mg total) by mouth daily.

## 2019-04-23 ENCOUNTER — Telehealth: Payer: Self-pay | Admitting: Medical

## 2019-04-23 NOTE — Telephone Encounter (Signed)
Pt called and states that she is leaving to go out of town next Friday on a plane and gets real nevous and anxious  on a plane and would like a rx for xanax pt would like it sent to the [place], [address] AT [address] informed pt that you was out of the office until Monday

## 2019-04-26 ENCOUNTER — Other Ambulatory Visit: Payer: Self-pay | Admitting: Medical

## 2019-04-26 MED ORDER — ALPRAZOLAM 0.5 MG PO TABS: 0.5000 mg | ORAL_TABLET | Freq: Every day | ORAL | 0 refills | Status: AC

## 2019-04-26 NOTE — Telephone Encounter (Signed)
rx sent

## 2019-04-28 NOTE — Telephone Encounter (Signed)
Called and left VM that RX was sent to the pharmacy

## 2019-05-05 ENCOUNTER — Other Ambulatory Visit: Payer: Self-pay | Admitting: Medical

## 2019-05-05 ENCOUNTER — Telehealth: Payer: Self-pay | Admitting: Family Medicine

## 2019-05-05 MED ORDER — FLUOXETINE HCL 20 MG PO TABS: 20.0000 mg | ORAL_TABLET | Freq: Every day | ORAL | 0 refills | Status: AC

## 2019-05-05 NOTE — Telephone Encounter (Signed)
I sent refill.  I wish her well, and appreciate her trusting in our care

## 2019-05-05 NOTE — Telephone Encounter (Signed)
Pt called and states Prozac working great, she has ran out.  Please call refill to [facility] on [address].  Pt is moving to [place] in a couple of weeks and will obtain pcp there.  We are emailing records release for pt to sign and she will call us when she has pcp.  [email or web address]

## 2019-05-07 ENCOUNTER — Other Ambulatory Visit: Payer: Self-pay | Admitting: *Deleted

## 2019-05-07 DIAGNOSIS — Z20822 Contact with and (suspected) exposure to covid-19: Secondary | ICD-10-CM

## 2019-05-07 NOTE — Telephone Encounter (Signed)
Left message for pt

## 2019-05-09 LAB — NOVEL CORONAVIRUS, NAA: SARS-CoV-2, NAA: NOT DETECTED
